# Patient Record
Sex: Male | Born: 1955 | Race: White | Hispanic: No | Marital: Married | Smoking: Never smoker
Health system: Southern US, Community
[De-identification: ages and names within clinical notes are randomized; demographics above are authoritative.]

## PROBLEM LIST (undated history)

## (undated) DIAGNOSIS — I10 Essential (primary) hypertension: Secondary | ICD-10-CM

## (undated) DIAGNOSIS — D803 Selective deficiency of immunoglobulin G [IgG] subclasses: Secondary | ICD-10-CM

## (undated) DIAGNOSIS — M1990 Unspecified osteoarthritis, unspecified site: Secondary | ICD-10-CM

## (undated) DIAGNOSIS — E119 Type 2 diabetes mellitus without complications: Secondary | ICD-10-CM

## (undated) DIAGNOSIS — E039 Hypothyroidism, unspecified: Secondary | ICD-10-CM

## (undated) DIAGNOSIS — N189 Chronic kidney disease, unspecified: Secondary | ICD-10-CM

## (undated) DIAGNOSIS — G4730 Sleep apnea, unspecified: Secondary | ICD-10-CM

## (undated) DIAGNOSIS — E559 Vitamin D deficiency, unspecified: Secondary | ICD-10-CM

## (undated) HISTORY — PX: KNEE ARTHROSCOPY: SUR90

## (undated) HISTORY — DX: Type 2 diabetes mellitus without complications: E11.9

## (undated) HISTORY — DX: Essential (primary) hypertension: I10

## (undated) HISTORY — DX: Sleep apnea, unspecified: G47.30

## (undated) HISTORY — PX: CATARACT EXTRACTION: SUR2

## (undated) HISTORY — DX: Unspecified osteoarthritis, unspecified site: M19.90

---

## 2005-05-12 ENCOUNTER — Encounter: Admission: RE | Admit: 2005-05-12 | Discharge: 2005-05-12 | Payer: Self-pay | Admitting: Orthopedic Surgery

## 2005-05-19 ENCOUNTER — Ambulatory Visit (HOSPITAL_COMMUNITY): Admission: RE | Admit: 2005-05-19 | Discharge: 2005-05-20 | Payer: Self-pay | Admitting: Orthopedic Surgery

## 2006-07-20 HISTORY — PX: LASIK: SHX215

## 2008-01-06 ENCOUNTER — Ambulatory Visit (HOSPITAL_COMMUNITY): Admission: RE | Admit: 2008-01-06 | Discharge: 2008-01-06 | Payer: Self-pay | Admitting: Urology

## 2009-02-22 ENCOUNTER — Encounter: Admission: RE | Admit: 2009-02-22 | Discharge: 2009-02-22 | Payer: Self-pay | Admitting: Orthopedic Surgery

## 2009-02-28 ENCOUNTER — Ambulatory Visit (HOSPITAL_COMMUNITY): Admission: RE | Admit: 2009-02-28 | Discharge: 2009-02-28 | Payer: Self-pay | Admitting: Orthopedic Surgery

## 2010-10-25 LAB — BASIC METABOLIC PANEL
BUN: 21 mg/dL (ref 6–23)
CO2: 24 mEq/L (ref 19–32)
Calcium: 9.3 mg/dL (ref 8.4–10.5)
Chloride: 104 mEq/L (ref 96–112)
Creatinine, Ser: 1.53 mg/dL — ABNORMAL HIGH (ref 0.4–1.5)
GFR calc Af Amer: 58 mL/min — ABNORMAL LOW (ref >60)
GFR calc non Af Amer: 48 mL/min — ABNORMAL LOW (ref >60)
Glucose, Bld: 114 mg/dL — ABNORMAL HIGH (ref 70–99)
Potassium: 4 mEq/L (ref 3.5–5.1)
Sodium: 136 mEq/L (ref 135–145)

## 2010-10-25 LAB — CBC
HCT: 42.6 % (ref 39.0–52.0)
Hemoglobin: 14.8 g/dL (ref 13.0–17.0)
MCHC: 34.7 g/dL (ref 30.0–36.0)
MCV: 91.2 fL (ref 78.0–100.0)
Platelets: 170 10*3/uL (ref 150–400)
RBC: 4.67 MIL/uL (ref 4.22–5.81)
RDW: 13.2 % (ref 11.5–15.5)
WBC: 6.8 10*3/uL (ref 4.0–10.5)

## 2010-12-02 NOTE — Op Note (Signed)
Patrick Kane, BAUMGARTEN               ACCOUNT NO.:  [id number]   MEDICAL RECORD NO.:  [id number]          PATIENT TYPE:  AMB   LOCATION:  SDS                          FACILITY:  [facility]   PHYSICIAN:  Rodney A. Mortenson, M.D.DATE OF BIRTH:  [birth year]   DATE OF PROCEDURE:  02/28/2009  DATE OF DISCHARGE:  02/28/2009                               OPERATIVE REPORT   JUSTIFICATION:  A 55 year old male started having pain in his left knee  in August 2009.  X-rays revealed partial collapse of medial compartment,  no loose bodies.  There was some concern he may have a tear of his  medial meniscus with his localized tenderness.  The pain continued and  has failed all conservative care.  An MRI was then done and this showed  severe tricompartmental osteoarthritis, worsening medial compartment,  and an oblique undersurface tear of the posterior horn of medial  meniscus with extrusion of meniscus.  Because of persistent pain and  discomfort, he is now admitted for arthroscopic evaluation and  treatment.  It was clearly explained to the patient mostly his pain may  be related to the arthritic changes in his knee or a large part could be  secondary to the torn meniscus.  He wished to proceed in the hopes that  the menisci element is the major portion, but no guarantee could be  given as to outcome and he clearly understands.  Questions answered and  encouraged.  Complication discussed extensively.   JUSTIFICATION OUTPATIENT SURGERY:  Minimal morbidity.   PREOPERATIVE DIAGNOSIS:  Osteoarthritis, left knee; tear of posterior  horn of medial meniscus, left knee.   POSTOPERATIVE DIAGNOSIS:  Severe osteoarthritis, left knee with multiple  horizontal tears undersurface of posterior horn of medial meniscus, left  knee.   OPERATION:  Arthroscopy; debridement of posterior horn of medial  meniscus, left knee.   SURGEON:  Lenard Galloway. Chaney Malling, MD   ASSISTANT:  Oris Drone. Petrarca, PA-C   ANESTHESIA:   General.   PATHOLOGY:  With arthroscope of the knee, very careful examination was  undertaken.  There was fair loss of articular cartilage in the femoral  notch and this showed grade 3 changes.  There were grade 2 changes on  the posterior aspect of patella.  In the lateral compartment, there is  normal articular cartilage.  The lateral femoral condyle, lateral tibial  plateau, and entire circumference of lateral meniscus appeared normal.  The anterior cruciate was normal.  All the pathology seen in the medial  compartment.  There was an area of complete raw bone over the medial  tibial plateau and there was fraying and tearing of articular cartilage  of the weightbearing area of the medial femoral condyle.  The medial  meniscus was visualized and was deformed, but no huge tears could be  seen.  The posterior horn was lifted, there were multiple horizontal  tears in the posterior horn on the undersurface.  This could not be  displaced into the joint and this was not flipped into the joint as was  seen on the MRI.   PROCEDURE:  The patient placed  on the operating table in supine  position.  A pneumatic tourniquet was applied about the left upper  thigh.  Entire left lower extremity was prepped with DuraPrep and draped  out in the usual manner.  An infusion cannula was placed superomedial  pouch and the knee was distended with saline.  Anteromedial,  anterolateral, and midline patellar portal were made as the knee was  very large and difficult to access.  Findings as described above.  Most  pathology that was in the medial compartment.  Again, there were  horizontal tears in the undersurface of the posterior horn and through  both medial and lateral portals, the posterior horn was debrided with a  small basket.  Once this was accomplished to my satisfaction with fairly  good decompression, ArthroCare wand was put in place.  The remaining rim  was then smoothed and balanced and sculpted.   Once this was accomplished  to my satisfaction, Marcaine was placed in the knee  Several of the  large portals were closed with 3-0 nylon and a large bulky pressure  dressing was applied.  The patient was returned to recovery room in  excellent condition.  Technically, this went extremely well.   DISPOSITION:  1. Percocet for pain.  2. Follow up in my office on Friday in [place] next week.  3. Return sooner if he has any trouble.  4. Usual postop instructions were given.      Rodney A. Chaney Malling, M.D.  Electronically Signed     RAM/MEDQ  D:  02/28/2009  T:  03/01/2009  Job:  [id number]

## 2010-12-05 NOTE — Op Note (Signed)
Patrick Kane, Patrick Kane               ACCOUNT NO.:  [id number]   MEDICAL RECORD NO.:  [id number]          PATIENT TYPE:  AMB   LOCATION:  SDS                          FACILITY:  [facility]   PHYSICIAN:  Rodney A. Mortenson, M.D.DATE OF BIRTH:  [birth year]   DATE OF PROCEDURE:  05/19/2005  DATE OF DISCHARGE:                                 OPERATIVE REPORT   PREOPERATIVE DIAGNOSIS:  Tear medial meniscus, right knee.   POSTOPERATIVE DIAGNOSIS:  Tear medial meniscus, right knee; grade 4 lesion  femoral notch area; chondromalacia of the right patella, loose body, right  knee.   OPERATION:  Partial medial meniscectomy, chondroplasty of femoral notch and  patella, removal of loose body, right knee.   ANESTHESIA:  General.   SURGEON:  Rodney A. Chaney Malling, M.D.   PATHOLOGY:  With the arthroscope in the knee, very careful examination of  the knee was undertaken.  The patellofemoral joint was visualized first.  There was some chondromalacia of the patella but there was a large lesion in  the femoral notch area where there was full thickness articular cartilage  loss.  There was also a loose body which was attached to the superior aspect  of the femur at the articular and nonarticular edge just medial to the  medial patellar facet.  The arthroscope was then passed into the lateral  compartment.  There was normal articular cartilage, lateral femoral condyle,  lateral tibial plateau, and the entire circumference of the lateral meniscus  was normal.  The ACL was visualized and this was normal.  The arthroscope  was placed in the medial compartment.  There was fairly normal looking  articular cartilage over the femoral condyle.  There was early cartilage  damage to the medial tibial plateau but this may be grade 1 and grade 2  changes.  There was a radial tear of the mid third of the medial meniscus  and the tear extended from the free edge all the way to the capsular  attachment.   PROCEDURE:  The  patient was placed on the operating table in supine  position, pneumatic tourniquet about the right upper thigh.  The right leg  was placed in a leg holder and the entire right lower extremity was prepped  with DuraPrep and draped out in the usual manner.  An infusion cannula was  placed in the superomedial pouch and the knee distended with saline.  An  anteromedial and anterolateral portal was made and the arthroscope was  introduced.   Attention was turned first to the superior patellar area.  There was  chondromalacia of the patella and this was debrided with a chondroplasty  shaver.  There was a large loose body attached to the anterior aspect of the  femur just proximal to the articular surface of the medial femoral condyle  and this was grasped with a pituitary and removed and this area was then  debrided.  Down in the femoral notch area, there was an area of total loss  of articular cartilage and this was debrided with the chondroplasty shaver.  The arthroscope was then placed in  the lateral compartment and, as noted  above, this was normal.  The arthroscope was then placed in the medial  compartment.  There was a radial tear of the medial meniscus which extended  from the free edge all the way to the capsular  attachment.  This could be  lifted with a probe.  A series of baskets were inserted through both medial  and lateral portals and this area was debrided aggressively.  The intra-  articular shaver was then introduced and the rim was smoothed and balanced  to a nice transition to the mid third and the posterior third.  No other  significant lesion was seen in the knee.  The  knee was then filled with Marcaine.  A large , bulky, pressure dressing was  applied.  The patient returned to the recovery room in excellent condition.  Technically, the procedure went extremely well.  Drains none.  Complications  none.           ______________________________  Patrick Kane. Chaney Malling,  M.D.     RAM/MEDQ  D:  05/19/2005  T:  05/19/2005  Job:  [id number]

## 2014-07-20 HISTORY — PX: OTHER SURGICAL HISTORY: SHX169

## 2014-11-29 ENCOUNTER — Ambulatory Visit (HOSPITAL_COMMUNITY): Admission: RE | Admit: 2014-11-29 | Payer: Medicare Other | Source: Ambulatory Visit

## 2014-11-29 ENCOUNTER — Ambulatory Visit (HOSPITAL_COMMUNITY): Payer: Medicare Other

## 2014-11-29 ENCOUNTER — Ambulatory Visit (HOSPITAL_COMMUNITY)
Admission: RE | Admit: 2014-11-29 | Discharge: 2014-11-29 | Disposition: A | Discharge status: Home / Self Care | Payer: Medicare Other | Source: Ambulatory Visit | Attending: Neurology | Admitting: Neurology

## 2014-11-29 ENCOUNTER — Encounter: Payer: Self-pay | Admitting: Neurology

## 2014-11-29 ENCOUNTER — Telehealth: Payer: Self-pay | Admitting: Neurology

## 2014-11-29 ENCOUNTER — Other Ambulatory Visit: Payer: Self-pay | Admitting: Neurology

## 2014-11-29 ENCOUNTER — Ambulatory Visit (INDEPENDENT_AMBULATORY_CARE_PROVIDER_SITE_OTHER): Payer: Medicare Other | Admitting: Neurology

## 2014-11-29 VITALS — BP 156/96 | HR 76 | Temp 98.2°F | Ht 69.0 in | Wt 384.6 lb

## 2014-11-29 DIAGNOSIS — R930 Abnormal findings on diagnostic imaging of skull and head, not elsewhere classified: Secondary | ICD-10-CM | POA: Diagnosis not present

## 2014-11-29 DIAGNOSIS — R51 Headache: Secondary | ICD-10-CM | POA: Diagnosis not present

## 2014-11-29 DIAGNOSIS — H0520 Unspecified exophthalmos: Secondary | ICD-10-CM

## 2014-11-29 DIAGNOSIS — I670 Dissection of cerebral arteries, nonruptured: Secondary | ICD-10-CM

## 2014-11-29 DIAGNOSIS — H532 Diplopia: Secondary | ICD-10-CM

## 2014-11-29 DIAGNOSIS — H4922 Sixth [abducent] nerve palsy, left eye: Secondary | ICD-10-CM | POA: Diagnosis not present

## 2014-11-29 LAB — CREATININE, SERUM
Creatinine, Ser: 1.73 mg/dL — ABNORMAL HIGH (ref 0.61–1.24)
GFR calc Af Amer: 48 mL/min — ABNORMAL LOW (ref >60)
GFR calc non Af Amer: 41 mL/min — ABNORMAL LOW (ref >60)

## 2014-11-29 MED ORDER — GADOBENATE DIMEGLUMINE 529 MG/ML IV SOLN
10.0000 mL | Freq: Once | INTRAVENOUS | Status: AC | PRN
  Administered 2014-11-29: 10 mL via INTRAVENOUS

## 2014-11-29 NOTE — Telephone Encounter (Signed)
Patrick Kane from [facility] [phone]) called and stated that they need to know how long it will be until the patients blood results are in from his appt. Today. The patient is in their office and they cannot do anything until they have the results from those labs. Please call and advise.

## 2014-11-29 NOTE — Progress Notes (Addendum)
[facility]    Provider:  Dr Jaynee Eagles Referring Provider: Okey Regal, OD Primary Care Physician:  Glenda Chroman., MD  CC:  Acute diplopia  HPI:  Patrick Kane is a 59 y.o. male here as a referral from Dr. Hassell Done for Acute diplopia. He has a PMHx of morbid obesity, OSA on cpap, HTN. Diplopia was acute in onset. Started Saturday, he had a severe sinus headache.  He took his daughter to work. While he was driving, he noticed the double vision. He was seeing the cars and roads "side by side", seeing double. The double vision is worse farther away and looking straight and to the left. He has had exopthalmos for a long time but today they notice L>>R proptosis  that is completely new. The double vision is only when he has both eyes open, it resolves with one eye closed. No pain on eye movement. Eye is dry, itchy. No headache since Saturday. Closing one eye makes the double vision go away. He denies any other focal neurologic deficits. He endorses an extremely high cpap pressure that makes his eyes "pop out" in the morning with resolution after stopping the cpap. No previous incidents of diplopia before.   MRI shows diffusely abnormal superficial face and periorbital soft tissue appearance. The bilateral parotid and lacrimal glands are affected and are diffusely abnormal. 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    Reviewed notes, labs and imaging from outside physicians, which showed:  Personally reviewed images and agree with the following. Personally discussed with neuroradiogy:  IMPRESSION: 1. Contrast could not be administered due to inability to obtain IV access. 2. Dominant finding is diffusely abnormal superficial face and periorbital soft tissue appearance. The bilateral parotid  and lacrimal glands are affected and are diffusely abnormal. 3. Bilateral exophthalmos and left worse than right lateral rectus muscle enlargement. 4. Negative non contrast MRI and MRA appearance of the brain. 5. The imaging appearance of #2 and #3 is nonspecific. Favor a systemic inflammatory process as the unifying diagnosis in this case. Dermatology consultation may be valuable. A combination of Thyroid Orbitopathy and Thyroid Acropachy is considered (such as due to Graves disease), but the pattern of extraocular muscle involvement seen here would be very atypical. An unusual infection or unusual presentation of malignancy such as lymphoma also should be considered.  Patient uses full CPAP mask with a very high 21/16 pressure. Morbidly obese at 384.6 pounds. Records reviewed by my eye doctor optometry of [facility] in [place] show patient was seen about his double vision. No visual floaters or light flashes. No pain. Did have a sinus headache before onset. 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    Review of Systems: Patient complains of symptoms per HPI as well as the following symptoms: Double vision, eye pain, headache, ringing in ears, allergies. Pertinent negatives per HPI. All others negative.   History   Social History  . Marital Status: Married    Spouse Name: N/A  . Number of Children: N/A  . Years of Education: N/A   Occupational History  . Not on file.   Social History Main Topics  . Smoking status: Not on file  . Smokeless tobacco: Not on file  . Alcohol Use: Not on file  . Drug Use: Not on file  . Sexual Activity: Not  on file   Other Topics Concern  . Not on file   Social History Narrative    No family history on file.  Past Medical History  Diagnosis Date  . Hypertension   . Diabetes   . Sleep apnea   . Arthritis     Past Surgical History  Procedure Laterality Date  . Lasik Bilateral 2008  . Knee arthroscopy Bilateral      Current Outpatient Prescriptions  Medication Sig Dispense Refill  . allopurinol (ZYLOPRIM) 300 MG tablet Take 150 mg by mouth daily.    Marland Kitchen amitriptyline (ELAVIL) 25 MG tablet Take 25 mg by mouth as needed for sleep.    . cetirizine (ZYRTEC) 10 MG tablet Take 10 mg by mouth daily.    . fexofenadine (ALLEGRA) 180 MG tablet Take 180 mg by mouth daily.    Marland Kitchen gabapentin (NEURONTIN) 800 MG tablet Take 400 mg by mouth daily.    . metoprolol succinate (TOPROL-XL) 100 MG 24 hr tablet Take 150 mg by mouth daily. Takes 1.5 tablets each day    . ONETOUCH VERIO test strip     . tiZANidine (ZANAFLEX) 4 MG tablet Take 4 mg by mouth daily. At night    . triamterene-hydrochlorothiazide (MAXZIDE-25) 37.5-25 MG per tablet Take 0.5 tablets by mouth daily.     No current facility-administered medications for this visit.    Allergies as of 11/29/2014 - Review Complete 11/29/2014  Allergen Reaction Noted  . Codeine  11/29/2014    Vitals: BP 156/96 mmHg  Pulse 76  Temp(Src) 98.2 F (36.8 C)  Ht 5\' 9"  (1.753 m)  Wt 384 lb 9.6 oz (174.453 kg)  BMI 56.77 kg/m2 Last Weight:  Wt Readings from Last 1 Encounters:  11/29/14 384 lb 9.6 oz (174.453 kg)   Last Height:   Ht Readings from Last 1 Encounters:  11/29/14 5\' 9"  (1.753 m)   Physical exam: Exam: Gen: NAD, conversant, well nourised, morbidly obese, well groomed                     CV: RRR, no MRG. No Carotid Bruits.  Eyes: Bilateral conjunctival injection left > right  Neuro: Detailed Neurologic Exam  Speech:    Speech is normal; fluent and spontaneous with normal comprehension.  Cognition:    The patient is oriented to person, place, and time;     recent and remote memory intact;     language fluent;     normal attention, concentration,     fund of knowledge Cranial Nerves:    The pupils are equal, round, and reactive to light.Unable to visualize fundi, attempted. Left >> right proptosis. Inability to completely abduct left eye. OD  20/50, OS 20/50. Visual fields are full to finger confrontation. Trigeminal sensation is intact and the muscles of mastication are normal. The face is symmetric. The palate elevates in the midline. Hearing intact. Voice is normal. Shoulder shrug is normal. The tongue has normal motion without fasciculations.   Coordination:    Normal finger to nose, difficulty HTS due to morbid obesisy  Gait:    Wide based due to morbid obesity  Motor Observation:    No asymmetry, no atrophy, and no involuntary movements noted. Tone:    Normal muscle tone.    Posture:    Posture is normal. normal erect    Strength:    Strength is V/V in the upper and lower limbs.      Sensation: intact to LT  Reflex Exam:  DTR's:    Deep tendon reflexes in the upper and lower extremities are hyporeflexic bilaterally, difficult to assess however due to large body habitus.   Toes:    The toes are downgoing bilaterally.   Clonus:    Clonus is absent.       Assessment/Plan:  59 year old morbidly obese patient with acute onset diplopia. 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   MRI shows diffusely abnormal superficial face and periorbital soft tissue appearance. The bilateral parotid and lacrimal glands are affected and are diffusely abnormal. The lateral recti muscles (L>R) are also enlarged which is the likely cause of patient's diplopia and proptosis.  Labs pending.  Suspicious for chronic systemic inflammatory/autoimmune disease in this patient such as Sjogren syndrome, sarcoidosis, thyroiditis, etc. (TSH was within normal limits however) But also highly suspicious of progression to an infiltrating lymphoma given the mass-like appearance of the lacrimal glands and lateral rectus muscles.   Skin punch biopsy and/or left lacrimal gland biopsy might prove most Valuable. Patient has an appointment with Dr. Nevada Crane who was a dermatologist in  [facility] for possible punch biopsy evaluation.   Sarina Ill, MD  [facility] [address] [place], [place] [address]  Phone [phone] Fax [phone]

## 2014-11-29 NOTE — Telephone Encounter (Signed)
Talked with Tashia and Dr. Jaynee Eagles will put in stat creatinine for pt to have done before MRI at 6 pm. Dr. Jaynee Eagles putting in orders. Tashia verbalized understanding.

## 2014-11-30 ENCOUNTER — Other Ambulatory Visit: Payer: Self-pay | Admitting: Neurology

## 2014-11-30 ENCOUNTER — Other Ambulatory Visit: Payer: Self-pay | Admitting: *Deleted

## 2014-11-30 ENCOUNTER — Telehealth: Payer: Self-pay | Admitting: Neurology

## 2014-11-30 ENCOUNTER — Ambulatory Visit (HOSPITAL_COMMUNITY): Payer: Self-pay

## 2014-11-30 DIAGNOSIS — C8591 Non-Hodgkin lymphoma, unspecified, lymph nodes of head, face, and neck: Secondary | ICD-10-CM

## 2014-11-30 DIAGNOSIS — L089 Local infection of the skin and subcutaneous tissue, unspecified: Secondary | ICD-10-CM

## 2014-11-30 DIAGNOSIS — L989 Disorder of the skin and subcutaneous tissue, unspecified: Secondary | ICD-10-CM

## 2014-11-30 DIAGNOSIS — D759 Disease of blood and blood-forming organs, unspecified: Secondary | ICD-10-CM

## 2014-11-30 DIAGNOSIS — M069 Rheumatoid arthritis, unspecified: Secondary | ICD-10-CM

## 2014-11-30 LAB — CBC
Hematocrit: 47.8 % (ref 37.5–51.0)
Hemoglobin: 16.2 g/dL (ref 12.6–17.7)
MCH: 31.3 pg (ref 26.6–33.0)
MCHC: 33.9 g/dL (ref 31.5–35.7)
MCV: 93 fL (ref 79–97)
Platelets: 181 10*3/uL (ref 150–379)
RBC: 5.17 x10E6/uL (ref 4.14–5.80)
RDW: 14.4 % (ref 12.3–15.4)
WBC: 7.5 10*3/uL (ref 3.4–10.8)

## 2014-11-30 LAB — COMPREHENSIVE METABOLIC PANEL
ALT: 26 IU/L (ref 0–44)
AST: 23 IU/L (ref 0–40)
Albumin/Globulin Ratio: 1 — ABNORMAL LOW (ref 1.1–2.5)
Albumin: 4.1 g/dL (ref 3.5–5.5)
Alkaline Phosphatase: 91 IU/L (ref 39–117)
BUN/Creatinine Ratio: 11 (ref 9–20)
BUN: 18 mg/dL (ref 6–24)
Bilirubin Total: 0.6 mg/dL (ref 0.0–1.2)
CO2: 19 mmol/L (ref 18–29)
Calcium: 9.8 mg/dL (ref 8.7–10.2)
Chloride: 96 mmol/L — ABNORMAL LOW (ref 97–108)
Creatinine, Ser: 1.61 mg/dL — ABNORMAL HIGH (ref 0.76–1.27)
GFR calc Af Amer: 53 mL/min/{1.73_m2} — ABNORMAL LOW (ref >59)
GFR calc non Af Amer: 46 mL/min/{1.73_m2} — ABNORMAL LOW (ref >59)
Globulin, Total: 4.1 g/dL (ref 1.5–4.5)
Glucose: 104 mg/dL — ABNORMAL HIGH (ref 65–99)
Potassium: 4.3 mmol/L (ref 3.5–5.2)
Sodium: 140 mmol/L (ref 134–144)
Total Protein: 8.2 g/dL (ref 6.0–8.5)

## 2014-11-30 LAB — THYROID PANEL WITH TSH
Free Thyroxine Index: 2.6 (ref 1.2–4.9)
T3 Uptake Ratio: 26 % (ref 24–39)
T4, Total: 10.1 ug/dL (ref 4.5–12.0)
TSH: 4.47 u[IU]/mL (ref 0.450–4.500)

## 2014-11-30 LAB — C-REACTIVE PROTEIN: CRP: 14.5 mg/L — ABNORMAL HIGH (ref 0.0–4.9)

## 2014-11-30 LAB — SEDIMENTATION RATE: Sed Rate: 13 mm/hr (ref 0–30)

## 2014-11-30 NOTE — Telephone Encounter (Signed)
I have dicussed with Dr. Nevada Crane, he has diffuse facial soft tissue, parotid gland, lacriminal gland, periorbital soft tissues,  extraocular muscles infilatration  Like etiology are systemic inflammatory disease, such as Sjogren's, sarcoidosis, even Lymphoma,   Proceed with dermatology, or ophthalmologist evaluation, potential biopsy of involved tissues may shed light on the diagnosis

## 2014-11-30 NOTE — Telephone Encounter (Signed)
Patient has an appointment with Dr. Nevada Crane, dermatologist, in [place] on Monday. Will ensure he has MRI results.

## 2014-11-30 NOTE — Telephone Encounter (Signed)
Patient called and requested to speak with someone regarding his MRI results. Please call and advise.

## 2014-12-03 ENCOUNTER — Encounter: Payer: Self-pay | Admitting: *Deleted

## 2014-12-03 ENCOUNTER — Telehealth: Payer: Self-pay | Admitting: *Deleted

## 2014-12-03 ENCOUNTER — Telehealth: Payer: Self-pay | Admitting: Neurology

## 2014-12-03 NOTE — Telephone Encounter (Signed)
Called and spoke with Rod Holler to let her know I called [name] at [phone] and asked them to transfer me to the lab. I had them read of the orders from 11/30/14 which was a total of 10, and they can see all orders. I made Rod Holler aware. I told her she can go there and have pt get them done. She verbalized understanding. Rod Holler also wondering if they need to set up a follow- up appt. I looked at office visit notes and did not see anything, but told her I would check with Dr. Jaynee Eagles. She verbalized understanding.

## 2014-12-03 NOTE — Telephone Encounter (Signed)
Called Dr. Allyn Kenner, PA office and asked for fax number. They gave me [phone] as the fax number. I faxed over recent office visit notes at 8:48 am.

## 2014-12-03 NOTE — Telephone Encounter (Signed)
Patient should follow up with me after the results come back for the biopsies. Will call with lab results.  thanks

## 2014-12-03 NOTE — Telephone Encounter (Signed)
Talked with Rod Holler, the wife and they stated Patrick Kane cannot see the orders from Dr. Jaynee Eagles for lab work. Pt went to hospital and lab across the street. I spoke with Joycelyn Schmid from lab and she stated she cannot see the orders either. I told them to come tomorrow to our office to have lab work completed and to come before 12 or after 1230. Pt verbalized understanding.

## 2014-12-03 NOTE — Telephone Encounter (Signed)
error 

## 2014-12-03 NOTE — Telephone Encounter (Signed)
Received fax confirmation

## 2014-12-03 NOTE — Telephone Encounter (Signed)
Already spoke to patient, see previous phone note thanks

## 2014-12-03 NOTE — Telephone Encounter (Signed)
Patients wife called and stated that Dr. Jaynee Eagles called her Friday and told her that blood work needed to be ordered. Rod Holler stated that she has been waiting on the blood work request since Friday and the pt has an appt today at 3:00. She would like to speak with someone regarding this before his appt today. Please call and advise.

## 2014-12-03 NOTE — Telephone Encounter (Signed)
[place] Number [phone]

## 2014-12-04 ENCOUNTER — Other Ambulatory Visit (INDEPENDENT_AMBULATORY_CARE_PROVIDER_SITE_OTHER): Payer: Self-pay

## 2014-12-04 ENCOUNTER — Other Ambulatory Visit: Payer: Self-pay | Admitting: Neurology

## 2014-12-04 DIAGNOSIS — L089 Local infection of the skin and subcutaneous tissue, unspecified: Secondary | ICD-10-CM

## 2014-12-04 DIAGNOSIS — D759 Disease of blood and blood-forming organs, unspecified: Secondary | ICD-10-CM

## 2014-12-04 DIAGNOSIS — H5789 Other specified disorders of eye and adnexa: Secondary | ICD-10-CM

## 2014-12-04 DIAGNOSIS — Z0289 Encounter for other administrative examinations: Secondary | ICD-10-CM

## 2014-12-04 DIAGNOSIS — M069 Rheumatoid arthritis, unspecified: Secondary | ICD-10-CM

## 2014-12-04 DIAGNOSIS — E0500 Thyrotoxicosis with diffuse goiter without thyrotoxic crisis or storm: Secondary | ICD-10-CM

## 2014-12-04 DIAGNOSIS — L989 Disorder of the skin and subcutaneous tissue, unspecified: Secondary | ICD-10-CM

## 2014-12-04 NOTE — Telephone Encounter (Signed)
Called patient to let him know Dr. Jaynee Eagles would like him to follow up with her after the biopsy results come back and we will call about the lab work results. Pt stated he is coming between 1:00 and 2:00pm to have lab work done here at our office. I told him to call with any more questions. Pt verbalized understanding.

## 2014-12-06 LAB — MULTIPLE MYELOMA PANEL, SERUM
ALPHA 1: 0.2 g/dL (ref 0.1–0.4)
ALPHA2 GLOB SERPL ELPH-MCNC: 0.8 g/dL (ref 0.4–1.2)
Albumin SerPl Elph-Mcnc: 3.6 g/dL (ref 3.2–5.6)
Albumin/Glob SerPl: 0.9 (ref 0.7–2.0)
B-Globulin SerPl Elph-Mcnc: 1.5 g/dL — ABNORMAL HIGH (ref 0.6–1.3)
Gamma Glob SerPl Elph-Mcnc: 1.8 g/dL — ABNORMAL HIGH (ref 0.5–1.6)
Globulin, Total: 4.4 g/dL (ref 2.0–4.5)
IGM (IMMUNOGLOBULIN M), SRM: 14 mg/dL — AB (ref 20–172)
IgA/Immunoglobulin A, Serum: 109 mg/dL (ref 90–386)
IgG (Immunoglobin G), Serum: 2309 mg/dL — ABNORMAL HIGH (ref 700–1600)
Total Protein: 8 g/dL (ref 6.0–8.5)

## 2014-12-06 LAB — RPR: RPR Ser Ql: NONREACTIVE

## 2014-12-06 LAB — HIGH SENSITIVITY CRP: CRP, High Sensitivity: 6.4 mg/L — ABNORMAL HIGH (ref 0.00–3.00)

## 2014-12-06 LAB — PAN-ANCA
Atypical pANCA: <1:20 {titer}
Myeloperoxidase Ab: <9 U/mL (ref 0.0–9.0)
P-ANCA: <1:20 {titer}

## 2014-12-06 LAB — HIV ANTIBODY (ROUTINE TESTING W REFLEX): HIV Screen 4th Generation wRfx: NONREACTIVE

## 2014-12-06 LAB — ANTI-SCLERODERMA ANTIBODY: Scleroderma SCL-70: <0.2 AI (ref 0.0–0.9)

## 2014-12-06 LAB — ANGIOTENSIN CONVERTING ENZYME: Angio Convert Enzyme: 43 U/L (ref 14–82)

## 2014-12-06 LAB — RHEUMATOID FACTOR: RHEUMATOID FACTOR: 54.4 [IU]/mL — AB (ref 0.0–13.9)

## 2014-12-06 LAB — ANA W/REFLEX: Anti Nuclear Antibody(ANA): NEGATIVE

## 2014-12-11 ENCOUNTER — Telehealth: Payer: Self-pay

## 2014-12-11 ENCOUNTER — Other Ambulatory Visit: Payer: Self-pay | Admitting: Neurology

## 2014-12-11 DIAGNOSIS — L932 Other local lupus erythematosus: Secondary | ICD-10-CM

## 2014-12-11 DIAGNOSIS — H532 Diplopia: Secondary | ICD-10-CM

## 2014-12-11 DIAGNOSIS — M069 Rheumatoid arthritis, unspecified: Secondary | ICD-10-CM

## 2014-12-11 DIAGNOSIS — H4942 Progressive external ophthalmoplegia, left eye: Secondary | ICD-10-CM

## 2014-12-11 DIAGNOSIS — H0520 Unspecified exophthalmos: Secondary | ICD-10-CM

## 2014-12-11 NOTE — Telephone Encounter (Signed)
Pt called requesting to speak with someone regarding results to the blood work he had done last week. Please call and advise.

## 2014-12-11 NOTE — Telephone Encounter (Signed)
Spoke to patient, his RF was 44 will refer him to rheumatology. He would like a copy of all the labwork. Terrence Dupont, can you ask medical records to get him a copy please? Maybe he needs to sign a release of records. Thanks.

## 2014-12-11 NOTE — Telephone Encounter (Signed)
Patient has apt with Dr. Katy Fitch 12-12-2014 at 8:45 am. We are still waiting on response from Dr. Berna Bue this is where patient wanted to go . I have called Dr.Hawkes office and asked them to schedule patient as soon as they could notes and records have also been faxed.  Patient would like Dr.Ahern to call him today. Patient is fine with this process and will be looking forward to Dr.Ahern's call.

## 2014-12-12 ENCOUNTER — Encounter: Payer: Self-pay | Admitting: *Deleted

## 2014-12-12 NOTE — Patient Instructions (Signed)
Mailed pt lab results from 12/04/14 on 12/12/14.

## 2014-12-12 NOTE — Telephone Encounter (Signed)
Dr. Jaynee Eagles, I mailed pt lab results. Thank you.

## 2014-12-13 ENCOUNTER — Other Ambulatory Visit: Payer: Self-pay | Admitting: Neurology

## 2014-12-13 ENCOUNTER — Telehealth: Payer: Self-pay | Admitting: Neurology

## 2014-12-13 DIAGNOSIS — E079 Disorder of thyroid, unspecified: Secondary | ICD-10-CM

## 2014-12-13 NOTE — Telephone Encounter (Signed)
Spoke to patient. Saw Dr. Katy Fitch (Ophthalmology) and he recommended Endocrinology referral. Patient also said he can' t be seen in Rheumatology until end of July.   Dana(Or Joy or Larey Seat) - Would you please see if we can get an Endocrinology appointment for this patient within the next few weeks or asap?   Also, can we call around and see if any other rheumatologist has any earlier appointments than July?  I have printed out and left the referral packet notes on Dana's desk.   Thank you

## 2014-12-13 NOTE — Telephone Encounter (Signed)
Patrick Kane, pt's wife called requesting to speak with Dr. Jaynee Eagles regarding some referrals that were going to take place on pt's last visit. She does not remember the providers that he would be seeing. Patient did see an eye doctor, Dr. Katy Fitch yesterday 12/12/14. Please call and advise. Patrick Kane can be reached @ [phone]

## 2014-12-14 NOTE — Telephone Encounter (Signed)
I called [facility] [phone]. And they wanted me to resend notes and orders and they will call me back with an apt with first available doctor with in next couple of weeks. I will also follow back up with them before I leave today to make sure patient is scheduled. For his  [facility] apt. Fax [phone]  Faxed order and notes to END they have opening's they will call me back buy the end of the day of to schedule apt. I will also follow back up with them by the end of the day.  Dr. Dennie Bible timer [phone]  This is still in process but i will have apt soon it has to go threw referral process first.

## 2014-12-15 ENCOUNTER — Telehealth: Payer: Self-pay | Admitting: Neurology

## 2014-12-15 ENCOUNTER — Other Ambulatory Visit: Payer: Self-pay | Admitting: Neurology

## 2014-12-15 DIAGNOSIS — IMO0002 Reserved for concepts with insufficient information to code with codable children: Secondary | ICD-10-CM

## 2014-12-15 NOTE — Telephone Encounter (Signed)
Patrick Kane - Dr. Katy Fitch from ophthalmology recommended a few more lab tests for Patrick Kane. Can you please call him and let him know? We need to figure out how he can get them done at [facility]. Maybe we can call the supervisor and fax the lab requests there. Let him know we will work on it so there is no confusion like last time, thanks.

## 2014-12-18 ENCOUNTER — Telehealth: Payer: Self-pay | Admitting: Neurology

## 2014-12-18 ENCOUNTER — Inpatient Hospital Stay (HOSPITAL_COMMUNITY): Admit: 2014-12-18 | Payer: Self-pay

## 2014-12-18 NOTE — Telephone Encounter (Signed)
Spoke with Otila Kluver from [facility] lab and she stated she can see all lab work from 5/28  (4 labs) that Dr. Jaynee Eagles ordered for pt to have done. Pt wife, Rod Holler aware and is going to have pt get this done.  Also spoke with her about referrals to endocrinology and rheumatology and told her I would follow up with Rance Muir in our office to see if they can be sent somewhere else because insurance will not cover a visit at Dr. Legrand Como [place] office. She stated she would prefer to go to Dr. Chalmers Cater office at [facility] but there is no availability until July. She wants to get in to be seen sooner. I told her I would check with Hinton Dyer and we would check on the status. Pt wife verbalized understanding.

## 2014-12-18 NOTE — Telephone Encounter (Signed)
Patrick Kane, Dr. Katy Fitch (opthalmologist) recommended some additional lab work. I think these labs are a good idea, would you see how we can get patient to the lab? Maybe we can call [name] and fax the orders (get a name, talk to the supervisor possibly). Woul dyou arrange this with patient and [name] please? Thank you!

## 2014-12-19 ENCOUNTER — Ambulatory Visit (INDEPENDENT_AMBULATORY_CARE_PROVIDER_SITE_OTHER): Payer: Medicare Other | Admitting: Neurology

## 2014-12-19 ENCOUNTER — Other Ambulatory Visit: Payer: Self-pay | Admitting: *Deleted

## 2014-12-19 VITALS — BP 171/82 | HR 72 | Temp 97.5°F | Ht 69.0 in | Wt 389.0 lb

## 2014-12-19 DIAGNOSIS — E079 Disorder of thyroid, unspecified: Secondary | ICD-10-CM

## 2014-12-19 DIAGNOSIS — IMO0002 Reserved for concepts with insufficient information to code with codable children: Secondary | ICD-10-CM

## 2014-12-19 DIAGNOSIS — M069 Rheumatoid arthritis, unspecified: Secondary | ICD-10-CM

## 2014-12-19 DIAGNOSIS — E0500 Thyrotoxicosis with diffuse goiter without thyrotoxic crisis or storm: Secondary | ICD-10-CM | POA: Diagnosis not present

## 2014-12-19 DIAGNOSIS — C9190 Lymphoid leukemia, unspecified not having achieved remission: Secondary | ICD-10-CM

## 2014-12-19 DIAGNOSIS — L988 Other specified disorders of the skin and subcutaneous tissue: Secondary | ICD-10-CM

## 2014-12-19 DIAGNOSIS — L989 Disorder of the skin and subcutaneous tissue, unspecified: Secondary | ICD-10-CM | POA: Diagnosis not present

## 2014-12-19 DIAGNOSIS — D8989 Other specified disorders involving the immune mechanism, not elsewhere classified: Secondary | ICD-10-CM | POA: Diagnosis not present

## 2014-12-19 DIAGNOSIS — L932 Other local lupus erythematosus: Secondary | ICD-10-CM

## 2014-12-19 DIAGNOSIS — L986 Other infiltrative disorders of the skin and subcutaneous tissue: Secondary | ICD-10-CM

## 2014-12-19 MED ORDER — PREDNISONE 10 MG PO TABS: 40.0000 mg | ORAL_TABLET | Freq: Every day | ORAL | Status: DC

## 2014-12-19 NOTE — Telephone Encounter (Signed)
I have called Dr. Charlestine Night office and patient is scheduled for June 15 th at 1:30 . If any one cx's he is also on CX list for next week as well . The office will call patient to schedule. I have also called Dr. Almetta Lovely s office per wife request . They could schedule apt. Right then I was requested to fax order and notes and Dr. Almetta Lovely  office will call me with apt. I did relay patient needed to be seen soon.  Patient's wife also aware of details . Patient will be here to see Dr.Ahren today.

## 2014-12-19 NOTE — Progress Notes (Signed)
[facility]   Provider: Dr Jaynee Eagles Referring Provider: Okey Regal, OD Primary Care Physician: Glenda Chroman., MD  CC: Acute diplopia  Interval update: The double vision is still there when he looks to the far left. The left  side of the face is fuller, feels like a tennis ball in his cheeks. He took some left over prednisone for a few days and he feels better. Discussed the biopsy which showed diffuse lymphocytic infiltration of the gland, tissues of his face as well as bilateral lateral right rectus muscles. Extensive lab work including ANA with reflex, angiotensin-converting enzyme, HIV, multiple myeloma, pan-Anka, CRP, anti-scleroderma antibody, RPR were all unremarkable. His rheumatoid factor did return high at 54. Reviewed imaging and imaging results. Also discussed recommendations from Dr. Carolynn Sayers that we consider a very rare disorder called IgG4 related disease. We'll test for IgG, IgA, IgM, IgG, IgE and IgG4 specifically and other labs today. Rheumatology and endocrinology appointments have been scheduled. We'll also try and schedule him for parotid biopsy per Dr. Zenia Resides recommendations. Today we'll start steroids.  Patient has diabetes but he remains untreated because his hemoglobin A1c is less than 7. Discussed my concerns about the side effects of steroids especially elevated glucose and the possibility for diabetic ketoacidosis. Patient and wife very much would like to start steroids today. His wife is in attendance today. Discussed other side effects including insomnia, mania, high blood pressure, increased appetite, edema. Have asked patient to check his glucose 4 times daily. We'll start him at a high dose of 40 mg and if glucose elevated significantly we'll decrease him immediately.    11/29/2014: Patrick Kane is a 59 y.o. male here as a referral from Dr. Hassell Done for Acute diplopia. He has a PMHx of morbid obesity, OSA on cpap, HTN. Diplopia was acute in onset.  Started Saturday, he had a severe sinus headache. He took his daughter to work. While he was driving, he noticed the double vision. He was seeing the cars and roads "side by side", seeing double. The double vision is worse farther away and looking straight and to the left. He has had exopthalmos for a long time but today they notice L>>R proptosis that is completely new. The double vision is only when he has both eyes open, it resolves with one eye closed. No pain on eye movement. Eye is dry, itchy. No headache since Saturday. Closing one eye makes the double vision go away. He denies any other focal neurologic deficits. He endorses an extremely high cpap pressure that makes his eyes "pop out" in the morning with resolution after stopping the cpap. No previous incidents of diplopia before.   MRI shows diffusely abnormal superficial face and periorbital soft tissue appearance. The bilateral parotid and lacrimal glands are affected and are diffusely abnormal. 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    Reviewed notes, labs and imaging from outside physicians, which showed:  Personally reviewed images and agree with the following. Personally discussed with neuroradiogy:  IMPRESSION: 1. Contrast could not be administered due to inability to obtain IV access. 2. Dominant finding is diffusely abnormal superficial face and periorbital soft tissue appearance. The bilateral parotid and lacrimal glands are affected and are diffusely abnormal. 3. Bilateral exophthalmos and left worse than right lateral rectus muscle enlargement. 4. Negative non contrast MRI and MRA  appearance of the brain. 5. The imaging appearance of #2 and #3 is nonspecific. Favor a systemic inflammatory process as the unifying diagnosis in this case.  Dermatology consultation may be valuable. A combination of Thyroid Orbitopathy and Thyroid Acropachy is considered (such as due to Graves disease), but the pattern of extraocular muscle involvement seen here would be very atypical. An unusual infection or unusual presentation of malignancy such as lymphoma also should be considered.  Patient uses full CPAP mask with a very high 21/16 pressure. Morbidly obese at 384.6 pounds. Records reviewed by my eye doctor optometry of [place] in [place] show patient was seen about his double vision. No visual floaters or light flashes. No pain. Did have a sinus headache before onset. 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   Review of Systems: Patient complains of symptoms per HPI as well as the following symptoms: Eye redness, double vision, facial swelling, sleep apnea, dizziness. Pertinent negatives per HPI. All others negative.   History   Social History  . Marital Status: Married    Spouse Name: N/A  . Number of Children: 2  . Years of Education: 12   Occupational History  . Retired     Social History Main Topics  . Smoking status: Never Smoker   . Smokeless tobacco: Not on file  . Alcohol Use: No  . Drug Use: No  . Sexual Activity: Not on file   Other Topics Concern  . Not on file   Social History Narrative   Lives at home with wife and 2 children   Caffeine use: 2-3 cups coffee per week       Family History  Problem Relation Age of Onset  . Colon cancer Father   . Hypertension Father   . Hypertension Mother   . Heart attack Paternal Grandfather   . Heart attack Maternal Grandfather     Past Medical History  Diagnosis Date  . Hypertension   . Diabetes   . Sleep apnea   . Arthritis     Past Surgical History  Procedure Laterality Date  . Lasik Bilateral 2008  . Knee arthroscopy Bilateral     Current Outpatient Prescriptions  Medication Sig Dispense Refill  . allopurinol (ZYLOPRIM) 300 MG  tablet Take 150 mg by mouth daily.    Marland Kitchen amitriptyline (ELAVIL) 25 MG tablet Take 25 mg by mouth as needed for sleep.    . cetirizine (ZYRTEC) 10 MG tablet Take 10 mg by mouth daily.    . fexofenadine (ALLEGRA) 180 MG tablet Take 180 mg by mouth daily.    Marland Kitchen gabapentin (NEURONTIN) 800 MG tablet Take 400 mg by mouth daily.    . metoprolol succinate (TOPROL-XL) 100 MG 24 hr tablet Take 150 mg by mouth daily. Takes 1.5 tablets each day    . ONETOUCH VERIO test strip     . predniSONE (DELTASONE) 10 MG tablet Take 4 tablets (40 mg total) by mouth daily with breakfast. 120 tablet 0  . tiZANidine (ZANAFLEX) 4 MG tablet Take 4 mg by mouth daily. At night    . triamterene-hydrochlorothiazide (MAXZIDE-25) 37.5-25 MG per tablet Take 0.5 tablets by mouth daily.     No current facility-administered medications for this visit.    Allergies as of 12/19/2014 - Review Complete 12/19/2014  Allergen Reaction Noted  . Codeine  11/29/2014    Vitals: BP 171/82 mmHg  Pulse 72  Temp(Src) 97.5 F (36.4 C)  Ht 5' 9"  (1.753 m)  Wt 389  lb (176.449 kg)  BMI 57.42 kg/m2 Last Weight:  Wt Readings from Last 1 Encounters:  12/19/14 389 lb (176.449 kg)   Last Height:   Ht Readings from Last 1 Encounters:  12/19/14 5' 9"  (1.753 m)   Eyes: Bilateral conjunctival injection left > right, exophthalmos L>R  Cranial Nerves:  The pupils are equal, round, and reactive to light.Unable to visualize fundi, attempted. Left >> right proptosis. Inability to completely abduct left eye. Visual fields are full to finger confrontation. Trigeminal sensation is intact and the muscles of mastication are normal. The face is symmetric. The palate elevates in the midline. Hearing intact. Voice is normal. Shoulder shrug is normal. The tongue has normal motion without fasciculations.   Assessment/Plan: 59 year old morbidly obese patient with acute onset diplopia. Exam shows incomplete abduction of the left eye likely due to his  enlarged lateralis rectus, left greater than right conjunctival injection and eye proptosis.   MRI shows diffusely abnormal superficial face and periorbital soft tissue appearance. The bilateral parotid and lacrimal glands are affected and are diffusely abnormal. The lateral recti muscles (L>R) are also enlarged which is the likely cause of patient's diplopia and proptosis.  Suspicious for chronic systemic inflammatory/autoimmune disease in this patient   Very much appreciate Dr. Zenia Resides recommendation that we consider a very rare disorder called IgG4 related disease. We will test for IgG, IgA, IgM, IgG, IgE and IgG4 specifically and other labs today. Rheumatology and endocrinology appointments have been scheduled. We'll also try and schedule him for parotid biopsy per Dr. Zenia Resides recommendations. Today we'll start steroids. Discussed side effects of steroids and concern with patient's diabetes (see details of discussion in history of present illness)  Patient will follow up in 4 weeks.  Sarina Ill, MD  [facility] [address] [place], [place] [address]  Phone [phone] Fax [phone]  A total of 45 minutes was spent face-to-face with this patient. Over half this time was spent on counseling patient on the Igg4-related rare disease diagnosis and different diagnostic and therapeutic options available.

## 2014-12-19 NOTE — Progress Notes (Signed)
Redid referrals for other providers in speciality's (rheumatology and endocrinology).

## 2014-12-19 NOTE — Patient Instructions (Signed)
As far as your medications are concerned, I would like to suggest: Prednisone 40mg  in the morning. Watch glucose 4x a day. Call for any side effects as discussed. Vigilant monitoring of blood pressure and weight.   I would like to see you back in 2 weeks, sooner if we need to. Please call us with any interim questions, concerns, problems, updates or refill requests.   Please also call us for any test results so we can go over those with you on the phone.  My clinical assistant and will answer any of your questions and relay your messages to me and also relay most of my messages to you.   Our phone number is [phone]. We also have an after hours call service for urgent matters and there is a physician on-call for urgent questions. For any emergencies you know to call 911 or go to the nearest emergency room

## 2014-12-19 NOTE — Addendum Note (Signed)
Addended byOliver Hum on: 12/19/2014 11:13 AM   Modules accepted: Orders

## 2014-12-20 ENCOUNTER — Other Ambulatory Visit (INDEPENDENT_AMBULATORY_CARE_PROVIDER_SITE_OTHER): Payer: Self-pay

## 2014-12-20 ENCOUNTER — Encounter: Payer: Self-pay | Admitting: *Deleted

## 2014-12-20 DIAGNOSIS — Z0289 Encounter for other administrative examinations: Secondary | ICD-10-CM

## 2014-12-20 NOTE — Telephone Encounter (Addendum)
Apt has been scheduled with Dr. Chalmers Cater 12/21/2014 at 10:30 . Called and left patient a message of details about his apt. For Dr. Chalmers Cater.

## 2014-12-20 NOTE — Progress Notes (Signed)
Copy of TSH and Hemoglobin A1c labs, with note from Dr. Katy Fitch office visit given to patient to bring to endocrinology appt when pt here getting additional labs drawn today.

## 2014-12-20 NOTE — Telephone Encounter (Signed)
Apt with Dr. Chalmers Cater has been scheduled 12-21-2014. At 10.30 am

## 2014-12-21 ENCOUNTER — Encounter: Payer: Self-pay | Admitting: Neurology

## 2014-12-21 DIAGNOSIS — L986 Other infiltrative disorders of the skin and subcutaneous tissue: Secondary | ICD-10-CM | POA: Insufficient documentation

## 2014-12-22 LAB — C3 AND C4
COMPLEMENT C3, SERUM: 141 mg/dL (ref 82–167)
COMPLEMENT C4, SERUM: 23 mg/dL (ref 14–44)

## 2014-12-22 LAB — CBC WITH DIFFERENTIAL/PLATELET
BASOS ABS: 0 10*3/uL (ref 0.0–0.2)
BASOS: 0 %
EOS (ABSOLUTE): 0 10*3/uL (ref 0.0–0.4)
Eos: 0 %
Hematocrit: 47.1 % (ref 37.5–51.0)
Hemoglobin: 15.6 g/dL (ref 12.6–17.7)
IMMATURE GRANULOCYTES: 0 %
Immature Grans (Abs): 0 10*3/uL (ref 0.0–0.1)
LYMPHS ABS: 1.4 10*3/uL (ref 0.7–3.1)
Lymphs: 16 %
MCH: 30.6 pg (ref 26.6–33.0)
MCHC: 33.1 g/dL (ref 31.5–35.7)
MCV: 92 fL (ref 79–97)
MONOCYTES: 5 %
Monocytes Absolute: 0.4 10*3/uL (ref 0.1–0.9)
NEUTROS ABS: 7.3 10*3/uL — AB (ref 1.4–7.0)
NEUTROS PCT: 79 %
PLATELETS: 196 10*3/uL (ref 150–379)
RBC: 5.1 x10E6/uL (ref 4.14–5.80)
RDW: 14.3 % (ref 12.3–15.4)
WBC: 9.2 10*3/uL (ref 3.4–10.8)

## 2014-12-22 LAB — SYSTEMIC LUPUS PROFILE A
ENA SM Ab Ser-aCnc: <0.2 AI (ref 0.0–0.9)
ENA SSA (RO) Ab: <0.2 AI (ref 0.0–0.9)
ENA SSB (LA) Ab: <0.2 AI (ref 0.0–0.9)
Rhuematoid fact SerPl-aCnc: 42.5 IU/mL — ABNORMAL HIGH (ref 0.0–13.9)
dsDNA Ab: 1 IU/mL (ref 0–9)

## 2014-12-22 LAB — CYCLIC CITRUL PEPTIDE ANTIBODY, IGG/IGA: Cyclic Citrullin Peptide Ab: 2 units (ref 0–19)

## 2014-12-24 LAB — MULTIPLE MYELOMA PANEL, SERUM
ALBUMIN SERPL ELPH-MCNC: 3.6 g/dL (ref 3.2–5.6)
Albumin/Glob SerPl: 0.8 (ref 0.7–2.0)
Alpha 1: 0.3 g/dL (ref 0.1–0.4)
Alpha2 Glob SerPl Elph-Mcnc: 0.8 g/dL (ref 0.4–1.2)
B-Globulin SerPl Elph-Mcnc: 1.1 g/dL (ref 0.6–1.3)
Gamma Glob SerPl Elph-Mcnc: 2.3 g/dL — ABNORMAL HIGH (ref 0.5–1.6)
Globulin, Total: 4.6 g/dL — ABNORMAL HIGH (ref 2.0–4.5)
IGM (IMMUNOGLOBULIN M), SRM: 14 mg/dL — AB (ref 20–172)
IgA/Immunoglobulin A, Serum: 119 mg/dL (ref 90–386)
IgG (Immunoglobin G), Serum: 2497 mg/dL — ABNORMAL HIGH (ref 700–1600)
TOTAL PROTEIN: 8.2 g/dL (ref 6.0–8.5)

## 2014-12-24 LAB — COMPREHENSIVE METABOLIC PANEL
ALT: 26 IU/L (ref 0–44)
AST: 21 IU/L (ref 0–40)
Albumin/Globulin Ratio: 1 — ABNORMAL LOW (ref 1.1–2.5)
Albumin: 3.9 g/dL (ref 3.5–5.5)
BUN/Creatinine Ratio: 11 (ref 9–20)
BUN: 18 mg/dL (ref 6–24)
Bilirubin Total: 0.2 mg/dL (ref 0.0–1.2)
Creatinine, Ser: 1.57 mg/dL — ABNORMAL HIGH (ref 0.76–1.27)
GFR calc Af Amer: 55 mL/min/{1.73_m2} — ABNORMAL LOW (ref >59)
GFR calc non Af Amer: 48 mL/min/{1.73_m2} — ABNORMAL LOW (ref >59)
GLUCOSE: 106 mg/dL — AB (ref 65–99)
Globulin, Total: 3.8 g/dL (ref 1.5–4.5)
Total Protein: 7.7 g/dL (ref 6.0–8.5)

## 2014-12-24 LAB — IGG, IGA, IGM
IGA/IMMUNOGLOBULIN A, SERUM: 103 mg/dL (ref 90–386)
IGM (IMMUNOGLOBULIN M), SRM: 14 mg/dL — AB (ref 20–172)
IgG (Immunoglobin G), Serum: 2244 mg/dL — ABNORMAL HIGH (ref 700–1600)

## 2014-12-24 LAB — SJOGREN'S SYNDROME ANTIBODS(SSA + SSB)
ENA SSA (RO) Ab: <0.2 AI (ref 0.0–0.9)
ENA SSB (LA) Ab: <0.2 AI (ref 0.0–0.9)

## 2014-12-24 LAB — HEMOGLOBIN A1C
Est. average glucose Bld gHb Est-mCnc: 157 mg/dL
HEMOGLOBIN A1C: 7.1 % — AB (ref 4.8–5.6)

## 2014-12-24 LAB — SEDIMENTATION RATE: SED RATE: 7 mm/h (ref 0–30)

## 2014-12-24 LAB — IGG 4: IGG 4: 11 mg/dL (ref 1–291)

## 2014-12-24 LAB — ANTI-DNA ANTIBODY, DOUBLE-STRANDED: dsDNA Ab: 1 IU/mL (ref 0–9)

## 2014-12-24 LAB — IGE: IGE (IMMUNOGLOBULIN E), SERUM: 89 [IU]/mL (ref 0–100)

## 2015-01-01 ENCOUNTER — Encounter: Payer: Self-pay | Admitting: Neurology

## 2015-01-01 ENCOUNTER — Ambulatory Visit (INDEPENDENT_AMBULATORY_CARE_PROVIDER_SITE_OTHER): Payer: Medicare Other | Admitting: Neurology

## 2015-01-01 VITALS — BP 185/90 | HR 69 | Ht 69.0 in | Wt 383.0 lb

## 2015-01-01 DIAGNOSIS — L986 Other infiltrative disorders of the skin and subcutaneous tissue: Secondary | ICD-10-CM

## 2015-01-01 DIAGNOSIS — L988 Other specified disorders of the skin and subcutaneous tissue: Secondary | ICD-10-CM | POA: Diagnosis not present

## 2015-01-01 NOTE — Progress Notes (Signed)
[facility]    Provider: Dr Jaynee Eagles Referring Provider: Okey Regal, OD Primary Care Physician: Glenda Chroman., MD  CC: Acute diplopia  Interval Update 01/01/2015: FAROUK VIVERO is a 59 y.o. male here as a referral from Dr. Hassell Done for Acute diplopia. He has a PMHx of morbid obesity, OSA on cpap, HTN. Diplopia was acute in onset. MRI shows diffusely abnormal superficial face and periorbital soft tissue appearance. The bilateral parotid and lacrimal glands are affected and are diffusely abnormal. The lateral recti muscles (L>R) are also enlarged which is the likely cause of patient's diplopia and proptosis.Suspicious for chronic systemic inflammatory/autoimmune disease in this patient.  He saw endocrinology since last appointment and was placed on synthroid. He is on 70m a day of steroids which significantly improved symptoms even in the first few days. He is monitoring his glucose appropriately. His face is significantly improved, diplopia resolved since starting the steroids. He cancelled his rheumtology appointment and is hoping to see Dr. HTrudie Reedinstead. Will send over all notes and labwork. At this point, still unclear of the diagnosis except for elevated Igg with normal Igg4 subclass. Will taper steroids 180ma week until at 1040maily then will hold. If symptoms worsen, will call me. His double vision has resolved. He feels better on the steroids.   Labs completed include: Elevated total IgG at 2244 (upper limit normal 1600 mg/dL), normal IgA and IgM and IgE and IgG subclass 4  Normal complement level CIII and C4, elevated rheumatoid factor and 42.5 with normal CCP antibodies but otherwise normal RNP antibodies smooth muscle antibodies chromatin antibodies Sjogren's antibodies and double-stranded DNA, CBC with differential unremarkable with only slightly elevated absolute neutrophils at 7.3, IFE showed a polyclonal gammopathy, sedimentation rate was normal at 7,  hemoglobin A1c 7.1, CMP with chronic kidney disease creatinine 1.57, angiotensin-converting enzyme within normal limits, HIV nonreactive, C8 Anka and p-ANCA normal, rheumatoid factor 54.4, high sensitivity CRP 6.4 (upper limit normal 3 mg/L), anti-scleroderma antibody within normal limits, RPR nonreactive, TSH 4.4,   Interval update 12/19/2014: The double vision is still there when he looks to the far left. The left side of the face is fuller, feels like a tennis ball in his cheeks. He took some left over prednisone for a few days and he feels better. Discussed the biopsy which showed diffuse lymphocytic infiltration of the gland, tissues of his face as well as bilateral lateral right rectus muscles. Extensive lab work including ANA with reflex, angiotensin-converting enzyme, HIV, multiple myeloma, pan-Anka, CRP, anti-scleroderma antibody, RPR were all unremarkable. His rheumatoid factor did return high at 54. Reviewed imaging and imaging results. Also discussed recommendations from Dr. GroCarolynn Sayersat we consider a very rare disorder called IgG4 related disease. We'll test for IgG, IgA, IgM, IgG, IgE and IgG4 specifically and other labs today. Rheumatology and endocrinology appointments have been scheduled. We'll also try and schedule him for parotid biopsy per Dr. GroZenia Residescommendations. Today we'll start steroids. Patient has diabetes but he remains untreated because his hemoglobin A1c is less than 7. Discussed my concerns about the side effects of steroids especially elevated glucose and the possibility for diabetic ketoacidosis. Patient and wife very much would like to start steroids today. His wife is in attendance today. Discussed other side effects including insomnia, mania, high blood pressure, increased appetite, edema. Have asked patient to check his glucose 4 times daily. We'll start him at a high dose of 40 mg and if glucose elevated significantly we'll decrease him immediately.  11/29/2014: EASTEN MACEACHERN is a 59 y.o. male here as a referral from Dr. Hassell Done for Acute diplopia. He has a PMHx of morbid obesity, OSA on cpap, HTN. Diplopia was acute in onset. Started Saturday, he had a severe sinus headache. He took his daughter to work. While he was driving, he noticed the double vision. He was seeing the cars and roads "side by side", seeing double. The double vision is worse farther away and looking straight and to the left. He has had exopthalmos for a long time but today they notice L>>R proptosis that is completely new. The double vision is only when he has both eyes open, it resolves with one eye closed. No pain on eye movement. Eye is dry, itchy. No headache since Saturday. Closing one eye makes the double vision go away. He denies any other focal neurologic deficits. He endorses an extremely high cpap pressure that makes his eyes "pop out" in the morning with resolution after stopping the cpap. No previous incidents of diplopia before.   MRI shows diffusely abnormal superficial face and periorbital soft tissue appearance. The bilateral parotid and lacrimal glands are affected and are diffusely abnormal. 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     Reviewed notes, labs and imaging from outside physicians, which showed:  Personally reviewed images and agree with the following. Personally discussed with neuroradiogy:  IMPRESSION: 1. Contrast could not be administered due to inability to obtain IV access. 2. Dominant finding is diffusely abnormal superficial face and periorbital soft tissue appearance. The bilateral parotid and lacrimal glands are affected and are diffusely abnormal. 3. Bilateral exophthalmos and left worse than right lateral rectus muscle enlargement. 4. Negative non contrast MRI and MRA  appearance of the brain. 5. The imaging appearance of #2 and #3 is nonspecific. Favor a systemic inflammatory process as the unifying diagnosis in this case. Dermatology consultation may be valuable. A combination of Thyroid Orbitopathy and Thyroid Acropachy is considered (such as due to Graves disease), but the pattern of extraocular muscle involvement seen here would be very atypical. An unusual infection or unusual presentation of malignancy such as lymphoma also should be considered.  Patient uses full CPAP mask with a very high 21/16 pressure. Morbidly obese at 384.6 pounds. Records reviewed by my eye doctor opt[facility] of [place] in [place] show patient was seen about his double vision. No visual floaters or light flashes. No pain. Did have a sinus headache before onset. 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    Review of Systems: Patient complains of symptoms per HPI as well as the following symptoms: Double vision, eye pain, headache, ringing in ears, allergies. Pertinent negatives per HPI. All others negative.   History   Social History  . Marital Status: Married    Spouse Name: N/A  . Number of Children: 2  . Years of Education: 12   Occupational History  . Retired     Social History Main Topics  . Smoking status: Never Smoker   . Smokeless tobacco: Not on file  . Alcohol Use: No  . Drug Use: No  . Sexual Activity: Not on file   Other Topics Concern  . Not on file   Social History Narrative   Lives at home with wife and 2 children   Caffeine use: 2-3 cups  coffee per week       Family History  Problem Relation Age of Onset  . Colon cancer Father   . Hypertension Father   . Hypertension Mother   . Heart attack Paternal Grandfather   . Heart attack Maternal Grandfather     Past Medical History  Diagnosis Date  . Hypertension   . Diabetes   . Sleep apnea   . Arthritis     Past Surgical History  Procedure Laterality Date  .  Lasik Bilateral 2008  . Knee arthroscopy Bilateral     Current Outpatient Prescriptions  Medication Sig Dispense Refill  . allopurinol (ZYLOPRIM) 300 MG tablet Take 150 mg by mouth daily.    Marland Kitchen amitriptyline (ELAVIL) 25 MG tablet Take 25 mg by mouth as needed for sleep.    . cetirizine (ZYRTEC) 10 MG tablet Take 10 mg by mouth daily.    . fexofenadine (ALLEGRA) 180 MG tablet Take 180 mg by mouth daily.    Marland Kitchen gabapentin (NEURONTIN) 800 MG tablet Take 400 mg by mouth daily.    . metoprolol succinate (TOPROL-XL) 100 MG 24 hr tablet Take 150 mg by mouth daily. Takes 1.5 tablets each day    . ONETOUCH VERIO test strip     . predniSONE (DELTASONE) 10 MG tablet Take 4 tablets (40 mg total) by mouth daily with breakfast. 120 tablet 0  . tiZANidine (ZANAFLEX) 4 MG tablet Take 4 mg by mouth daily. At night    . triamterene-hydrochlorothiazide (MAXZIDE-25) 37.5-25 MG per tablet Take 0.5 tablets by mouth daily.     No current facility-administered medications for this visit.    Allergies as of 01/01/2015 - Review Complete 12/19/2014  Allergen Reaction Noted  . Codeine  11/29/2014    Vitals: There were no vitals taken for this visit. Last Weight:  Wt Readings from Last 1 Encounters:  12/19/14 389 lb (176.449 kg)   Last Height:   Ht Readings from Last 1 Encounters:  12/19/14 $RemoveB'5\' 9"'[id number]$  (1.753 m)   Eyes: Bilateral conjunctival injection left > right, exophthalmos L>R much improved  Cranial Nerves:  The pupils are equal, round, and reactive to light.Unable to visualize fundi, attempted. Left >> right proptosis. Inability to completely abduct left eye - greatly improved. Visual fields are full to finger confrontation. Trigeminal sensation is intact and the muscles of mastication are normal. The face is symmetric. The palate elevates in the midline. Hearing intact. Voice is normal. Shoulder shrug is normal. The tongue has normal motion without fasciculations.  Assessment/Plan: 59 year old patient  with acute onset diplopia. Exam shows incomplete abduction of the left eye likely due to his enlarged lateralis rectus, left greater than right conjunctival injection and eye proptosis.   MRI shows diffusely abnormal superficial face and periorbital soft tissue appearance. The bilateral parotid and lacrimal glands are affected and are diffusely abnormal. The lateral recti muscles (L>R) are also enlarged which is the likely cause of patient's diplopia and proptosis.  Suspicious for chronic systemic inflammatory/autoimmune disease in this patient. Skin biopsy with dermatologist showed lymphocytic infiltrate that was nonspecific.  Patient's symptoms significantly improved after a few weeks on steroids.   Very much appreciate Dr. Zenia Resides recommendation that we consider a very rare disorder called IgG4 related disease. We will test for IgG, IgA, IgM, IgG, IgE and IgG4 specifically and other labs today(see results in HPI section). IGG elevated but subtype4 wnl. Will test for IGG1,2 and 3 today. Rheumatology and endocrinology appointments have been scheduled.  Will taper steroids.  Patient will follow up in 4 weeks.  Sarina Ill, MD  [facility] [address] [place], [place] [address]  Phone [phone] Fax [phone]  A total of 45 minutes was spent face-to-face with this patient. Over half this time was spent on counseling patient on the Igg-related rare disease diagnosis and different diagnostic and therapeutic options available.

## 2015-01-02 ENCOUNTER — Ambulatory Visit: Payer: Medicare Other | Admitting: Neurology

## 2015-01-02 LAB — IGG 1, 2, 3, AND 4
IGG 4: 14 mg/dL (ref 1–291)
IGG, SUBCLASS 1: 348 mg/dL — AB (ref 422–1292)
IgG (Immunoglobin G), Serum: 1514 mg/dL (ref 700–1600)
IgG, Subclass 2: 1010 mg/dL — ABNORMAL HIGH (ref 117–747)
IgG, Subclass 3: 27 mg/dL — ABNORMAL LOW (ref 41–129)

## 2015-01-08 ENCOUNTER — Telehealth: Payer: Self-pay | Admitting: Neurology

## 2015-01-08 NOTE — Telephone Encounter (Signed)
Patient called requesting lab results. Please call and advise. Patient can be reached at [phone].

## 2015-01-10 ENCOUNTER — Other Ambulatory Visit: Payer: Self-pay | Admitting: Neurology

## 2015-01-10 ENCOUNTER — Encounter: Payer: Self-pay | Admitting: *Deleted

## 2015-01-10 ENCOUNTER — Telehealth: Payer: Self-pay | Admitting: Neurology

## 2015-01-10 DIAGNOSIS — R897 Abnormal histological findings in specimens from other organs, systems and tissues: Secondary | ICD-10-CM

## 2015-01-10 NOTE — Telephone Encounter (Signed)
Spoke with patient. Elevated igg subclas 2. Will refer to ENt for possible parotid biopsy.

## 2015-01-15 ENCOUNTER — Other Ambulatory Visit (HOSPITAL_COMMUNITY): Payer: Self-pay | Admitting: Otolaryngology

## 2015-01-15 ENCOUNTER — Telehealth: Payer: Self-pay

## 2015-01-15 DIAGNOSIS — J342 Deviated nasal septum: Secondary | ICD-10-CM

## 2015-01-15 DIAGNOSIS — K119 Disease of salivary gland, unspecified: Secondary | ICD-10-CM

## 2015-01-15 NOTE — Telephone Encounter (Signed)
Pt's wife is calling stating that the pt's rheumatology appointment was changed to 7/15, and Dr. Jaynee Eagles needs to see the patient after the rheumatology appt. Pt is requesting an appt after 7/15 because his appt now is on 7/11. Please work the patient in as able.

## 2015-01-15 NOTE — Telephone Encounter (Signed)
Left VM for pt to call back to schedule a 74min f/u appt w/ Dr. Jaynee Eagles after 02/01/15. You can place him in a office visit slot or combine 2-39min slots if he calls back, thank you!

## 2015-01-18 ENCOUNTER — Ambulatory Visit (HOSPITAL_COMMUNITY)
Admission: RE | Admit: 2015-01-18 | Discharge: 2015-01-18 | Disposition: A | Discharge status: Home / Self Care | Payer: Medicare Other | Source: Ambulatory Visit | Attending: Otolaryngology | Admitting: Otolaryngology

## 2015-01-18 DIAGNOSIS — K119 Disease of salivary gland, unspecified: Secondary | ICD-10-CM | POA: Insufficient documentation

## 2015-01-18 DIAGNOSIS — J342 Deviated nasal septum: Secondary | ICD-10-CM

## 2015-01-28 ENCOUNTER — Ambulatory Visit: Payer: Medicare Other | Admitting: Neurology

## 2015-01-30 ENCOUNTER — Other Ambulatory Visit (HOSPITAL_COMMUNITY): Payer: Self-pay | Admitting: Otolaryngology

## 2015-01-30 DIAGNOSIS — R599 Enlarged lymph nodes, unspecified: Secondary | ICD-10-CM

## 2015-01-31 ENCOUNTER — Other Ambulatory Visit: Payer: Self-pay | Admitting: Radiology

## 2015-01-31 ENCOUNTER — Ambulatory Visit (HOSPITAL_COMMUNITY)
Admission: RE | Admit: 2015-01-31 | Discharge: 2015-01-31 | Disposition: A | Discharge status: Home / Self Care | Payer: Medicare Other | Source: Ambulatory Visit | Attending: Otolaryngology | Admitting: Otolaryngology

## 2015-01-31 ENCOUNTER — Encounter (HOSPITAL_COMMUNITY): Payer: Self-pay

## 2015-01-31 DIAGNOSIS — R599 Enlarged lymph nodes, unspecified: Secondary | ICD-10-CM | POA: Insufficient documentation

## 2015-01-31 DIAGNOSIS — R590 Localized enlarged lymph nodes: Secondary | ICD-10-CM | POA: Insufficient documentation

## 2015-01-31 DIAGNOSIS — G4730 Sleep apnea, unspecified: Secondary | ICD-10-CM | POA: Insufficient documentation

## 2015-01-31 DIAGNOSIS — E119 Type 2 diabetes mellitus without complications: Secondary | ICD-10-CM | POA: Insufficient documentation

## 2015-01-31 DIAGNOSIS — I10 Essential (primary) hypertension: Secondary | ICD-10-CM | POA: Insufficient documentation

## 2015-01-31 LAB — CBC
HCT: 45.8 % (ref 39.0–52.0)
HEMOGLOBIN: 15.9 g/dL (ref 13.0–17.0)
MCH: 31.7 pg (ref 26.0–34.0)
MCHC: 34.7 g/dL (ref 30.0–36.0)
MCV: 91.2 fL (ref 78.0–100.0)
PLATELETS: 157 10*3/uL (ref 150–400)
RBC: 5.02 MIL/uL (ref 4.22–5.81)
RDW: 13.1 % (ref 11.5–15.5)
WBC: 8.3 10*3/uL (ref 4.0–10.5)

## 2015-01-31 LAB — APTT: aPTT: 23 seconds — ABNORMAL LOW (ref 24–37)

## 2015-01-31 LAB — PROTIME-INR
INR: 0.97 (ref 0.00–1.49)
Prothrombin Time: 13.1 seconds (ref 11.6–15.2)

## 2015-01-31 MED ORDER — LIDOCAINE-EPINEPHRINE 1 %-1:100000 IJ SOLN
INTRAMUSCULAR | Status: AC
  Filled 2015-01-31: qty 1

## 2015-01-31 MED ORDER — GELATIN ABSORBABLE 12-7 MM EX MISC
CUTANEOUS | Status: DC
Start: 2015-01-31 — End: 2015-01-31
  Filled 2015-01-31: qty 1

## 2015-01-31 MED ORDER — SODIUM CHLORIDE 0.9 % IV SOLN: Freq: Once | INTRAVENOUS | Status: DC

## 2015-01-31 NOTE — Procedures (Signed)
Technically successful US guided FNA biopsy of dominant right sided parotid gland lymph node.  No immediate complications.

## 2015-01-31 NOTE — H&P (Signed)
Chief Complaint: Enlarged parotid lymph nodes  Referring Physician(s): Crossley,James J  History of Present Illness: Patrick Kane is a 59 y.o. male   Pt suffered blurred vision in Left eye May 2016 Work up ensued Favor rheumatoid abnormality per pt Noted B parotid lymphadenopathy on recent US Now scheduled for B of Rt parotid LN   Past Medical History  Diagnosis Date  . Hypertension   . Diabetes   . Sleep apnea   . Arthritis     Past Surgical History  Procedure Laterality Date  . Lasik Bilateral 2008  . Knee arthroscopy Bilateral     Allergies: Codeine  Medications: Prior to Admission medications   Medication Sig Start Date End Date Taking? Authorizing Provider  allopurinol (ZYLOPRIM) 300 MG tablet Take 150 mg by mouth daily. 11/27/14  Yes Historical Provider, MD  amitriptyline (ELAVIL) 25 MG tablet Take 25 mg by mouth as needed for sleep.   Yes Historical Provider, MD  gabapentin (NEURONTIN) 800 MG tablet Take 400-800 mg by mouth daily.  10/16/14  Yes Historical Provider, MD  glimepiride (AMARYL) 1 MG tablet Take 1 mg by mouth daily. 12/21/14  Yes Historical Provider, MD  levothyroxine (SYNTHROID, LEVOTHROID) 50 MCG tablet Take 50 mcg by mouth daily. 12/21/14  Yes Historical Provider, MD  metoprolol succinate (TOPROL-XL) 100 MG 24 hr tablet Take 150 mg by mouth daily. Takes 1.5 tablets each day 10/23/14  Yes Historical Provider, MD  [facility] DELICA LANCETS 32I MISC 1 each by Other route 3 (three) times daily.  12/21/14  Yes Historical Provider, MD  Glory Rosebush VERIO test strip 1 each by Other route 3 (three) times daily.  09/25/14  Yes Historical Provider, MD  predniSONE (DELTASONE) 5 MG tablet Take 5 mg by mouth daily.   Yes Historical Provider, MD  tiZANidine (ZANAFLEX) 4 MG tablet Take 4 mg by mouth at bedtime as needed for muscle spasms.  11/27/14  Yes Historical Provider, MD  triamterene-hydrochlorothiazide (MAXZIDE-25) 37.5-25 MG per tablet Take 0.5 tablets by mouth  daily. 10/23/14  Yes Historical Provider, MD     Family History  Problem Relation Age of Onset  . Colon cancer Father   . Hypertension Father   . Hypertension Mother   . Heart attack Paternal Grandfather   . Heart attack Maternal Grandfather     History   Social History  . Marital Status: Married    Spouse Name: N/A  . Number of Children: 2  . Years of Education: 12   Occupational History  . Retired     Social History Main Topics  . Smoking status: Never Smoker   . Smokeless tobacco: Not on file  . Alcohol Use: No  . Drug Use: No  . Sexual Activity: Not on file   Other Topics Concern  . None   Social History Narrative   Lives at home with wife and 2 children   Caffeine use: 2-3 cups coffee per week        Review of Systems: A 12 point ROS discussed and pertinent positives are indicated in the HPI above.  All other systems are negative.  Review of Systems  Constitutional: Negative for fever, activity change and fatigue.  Respiratory: Negative for shortness of breath.   Neurological: Negative for weakness.  Psychiatric/Behavioral: Negative for behavioral problems and confusion.    Vital Signs: BP 175/95 mmHg  Pulse 95  Temp(Src) 98.4 F (36.9 C)  Resp 18  Ht 5\' 9"  (1.753 m)  Wt 384 lb (  174.181 kg)  BMI 56.68 kg/m2  SpO2 99%  Physical Exam  Constitutional: He is oriented to person, place, and time. He appears well-nourished.  Eyes: EOM are normal.  exopthalmos  Neck: Normal range of motion.  Cardiovascular: Normal rate, regular rhythm and normal heart sounds.   Pulmonary/Chest: Effort normal and breath sounds normal. He has no wheezes.  Abdominal: Soft. Bowel sounds are normal.  Musculoskeletal: Normal range of motion.  Neurological: He is alert and oriented to person, place, and time.  Skin: Skin is warm and dry.  Psychiatric: He has a normal mood and affect. His behavior is normal. Judgment and thought content normal.  Nursing note and vitals  reviewed.   Mallampati Score:  MD Evaluation Airway: WNL Heart: WNL Abdomen: WNL Chest/ Lungs: WNL ASA  Classification: 3 Mallampati/Airway Score: Two  Imaging: US Soft Tissue Head/neck  01/18/2015   CLINICAL DATA:  Salivary gland disease.  EXAM: ULTRASOUND OF HEAD/NECK SOFT TISSUES  TECHNIQUE: Ultrasound examination of the head and neck soft tissues was performed in the area of clinical concern.  COMPARISON:  MRI 11/29/2014  FINDINGS: There are an oval-shaped structure in the right parotid tissue that measures 1.8 x 0.8 x 1.0 cm. This appears to have a fatty hilum and compatible with an intra-parotid lymph node. This also corresponds with the previous MRI findings. There is a second smaller lymph node in the right parotid tissue that measures 0.4 cm in short axis.  Images of the left parotid grand also demonstrate a lymph node measuring 1.6 x 0.7 x 0.8 cm. This has a normal fatty hilum. There is a second small nodular structure in the left thyroid lobe which has characteristics of a lymph node and measures 0.4 cm in the short axis.  IMPRESSION: Evidence for bilateral intra-parotid lymph nodes.   Electronically Signed   By: Markus Daft M.D.   On: 01/18/2015 16:20    Labs:  CBC:  Recent Labs  11/29/14 1511 12/20/14 1326  WBC 7.5 9.2  HCT 47.8 47.1    COAGS: No results for input(s): INR, APTT in the last 8760 hours.  BMP:  Recent Labs  11/29/14 1511 11/29/14 1715 12/19/14 1434  NA 140  --  CANCELED  K 4.3  --  CANCELED  CL 96*  --  CANCELED  CO2 19  --  CANCELED  GLUCOSE 104*  --  106*  BUN 18  --  18  CALCIUM 9.8  --  CANCELED  CREATININE 1.61* 1.73* 1.57*  GFRNONAA 46* 41* 48*  GFRAA 53* 48* 55*    LIVER FUNCTION TESTS:  Recent Labs  11/29/14 1511 12/04/14 1431 12/19/14 1434 12/20/14 1325  BILITOT 0.6  --  0.2  --   AST 23  --  21  --   ALT 26  --  26  --   ALKPHOS 91  --  CANCELED  --   PROT 8.2 8.0 7.7 8.2    TUMOR MARKERS: No results for input(s):  AFPTM, CEA, CA199, CHROMGRNA in the last 8760 hours.  Assessment and Plan:  B parotid LAN - enlargement Rt more than Lt Scheduled for biopsy of Rt parotid LN Risks and Benefits discussed with the patient including, but not limited to bleeding, infection, damage to adjacent structures or low yield requiring additional tests. All of the patient's questions were answered, patient is agreeable to proceed. Consent signed and in chart.   Thank you for this interesting consult.  I greatly enjoyed meeting Patrick Kane and  look forward to participating in their care.  Signed: Adarrius Graeff A 01/31/2015, 1:28 PM   I spent a total of  30 Minutes   in face to face in clinical consultation, greater than 50% of which was counseling/coordinating care for Rt parotid LN bx

## 2015-02-13 ENCOUNTER — Ambulatory Visit (INDEPENDENT_AMBULATORY_CARE_PROVIDER_SITE_OTHER): Payer: Medicare Other | Admitting: Neurology

## 2015-02-13 ENCOUNTER — Encounter: Payer: Self-pay | Admitting: Neurology

## 2015-02-13 VITALS — BP 182/92 | HR 76 | Temp 98.2°F | Ht 69.0 in | Wt 383.6 lb

## 2015-02-13 DIAGNOSIS — L988 Other specified disorders of the skin and subcutaneous tissue: Secondary | ICD-10-CM

## 2015-02-13 DIAGNOSIS — L986 Other infiltrative disorders of the skin and subcutaneous tissue: Secondary | ICD-10-CM

## 2015-02-13 NOTE — Progress Notes (Addendum)
[facility]    Provider:  Dr Jaynee Eagles Referring Provider: Glenda Chroman., MD Primary Care Physician:  Glenda Chroman., MD  Provider: Dr Jaynee Eagles Referring Provider: Okey Regal, OD Primary Care Physician: Glenda Chroman., MD  CC: Acute diplopia  Interval Update 01/01/2015: Patrick Kane is a 59 y.o. male here as a referral from Dr. Hassell Done for Acute diplopia. He has a PMHx of morbid obesity, OSA on cpap, HTN. Diplopia was acute in onset. MRI shows diffusely abnormal superficial face and periorbital soft tissue appearance. The bilateral parotid and lacrimal glands are affected and are diffusely abnormal. The lateral recti muscles (L>R) are also enlarged which is the likely cause of patient's diplopia and proptosis.Suspicious for chronic systemic inflammatory/autoimmune disease in this patient.  Steroids improved his double vision and the fullness in his face significantly and Diplopia resolved. However when he decreased to 46m prednisone daily his symptoms worsened. Will keep him on 145mfor now. He is scheduled with Rheumatology soon.  Had parotid biopsy, lymph node fine-needle aspiration of the inter-glandular parotid, which showed  mixed lymphoid population, the cytologic features favor a reactive process.   Interval Update 01/01/2015: Patrick AMSDENs a [age].o. male here as a referral from Dr. MaHassell Doneor Acute diplopia. He has a PMHx of morbid obesity, OSA on cpap, HTN. Diplopia was acute in onset. MRI shows diffusely abnormal superficial face and periorbital soft tissue appearance. The bilateral parotid and lacrimal glands are affected and are diffusely abnormal. The lateral recti muscles (L>R) are also enlarged which is the likely cause of patient's diplopia and proptosis.Suspicious for chronic systemic inflammatory/autoimmune disease in this patient.  He saw endocrinology since last appointment and was placed on synthroid. He is on 4020m day of steroids which  significantly improved symptoms even in the first few days. He is monitoring his glucose appropriately. His face is significantly improved, diplopia resolved since starting the steroids. He cancelled his rheumtology appointment and is hoping to see Dr. HawTrudie Reedstead. Will send over all notes and labwork. At this point, still unclear of the diagnosis except for elevated Igg with normal Igg4 subclass. Will taper steroids 93m22mweek until at 93mg39mly then will hold. If symptoms worsen, will call me. His double vision has resolved. He feels better on the steroids.   Labs completed include: Elevated total IgG at 2244 (upper limit normal 1600 mg/dL), normal IgA and IgM and IgE and IgG subclass 4  Normal complement level CIII and C4, elevated rheumatoid factor and 42.5 with normal CCP antibodies but otherwise normal RNP antibodies smooth muscle antibodies chromatin antibodies Sjogren's antibodies and double-stranded DNA, CBC with differential unremarkable with only slightly elevated absolute neutrophils at 7.3, IFE showed a polyclonal gammopathy, sedimentation rate was normal at 7, hemoglobin A1c 7.1, CMP with chronic kidney disease creatinine 1.57, angiotensin-converting enzyme within normal limits, HIV nonreactive, C8 Anka and p-ANCA normal, rheumatoid factor 54.4, high sensitivity CRP 6.4 (upper limit normal 3 mg/L), anti-scleroderma antibody within normal limits, RPR nonreactive, TSH 4.4,   Interval update 12/19/2014: The double vision is still there when he looks to the far left. The left side of the face is fuller, feels like a tennis ball in his cheeks. He took some left over prednisone for a few days and he feels better. Discussed the biopsy which showed diffuse lymphocytic infiltration of the gland, tissues of his face as well as bilateral lateral right rectus muscles. Extensive lab work including ANA with reflex, angiotensin-converting enzyme, HIV, multiple  myeloma, pan-Anka, CRP, anti-scleroderma  antibody, RPR were all unremarkable. His rheumatoid factor did return high at 54. Reviewed imaging and imaging results. Also discussed recommendations from Dr. Carolynn Sayers that we consider a very rare disorder called IgG4 related disease. We'll test for IgG, IgA, IgM, IgG, IgE and IgG4 specifically and other labs today. Rheumatology and endocrinology appointments have been scheduled. We'll also try and schedule him for parotid biopsy per Dr. Zenia Resides recommendations. Today we'll start steroids. Patient has diabetes but he remains untreated because his hemoglobin A1c is less than 7. Discussed my concerns about the side effects of steroids especially elevated glucose and the possibility for diabetic ketoacidosis. Patient and wife very much would like to start steroids today. His wife is in attendance today. Discussed other side effects including insomnia, mania, high blood pressure, increased appetite, edema. Have asked patient to check his glucose 4 times daily. We'll start him at a high dose of 40 mg and if glucose elevated significantly we'll decrease him immediately.    11/29/2014: Patrick Kane is a 59 y.o. male here as a referral from Dr. Hassell Done for Acute diplopia. He has a PMHx of morbid obesity, OSA on cpap, HTN. Diplopia was acute in onset. Started Saturday, he had a severe sinus headache. He took his daughter to work. While he was driving, he noticed the double vision. He was seeing the cars and roads "side by side", seeing double. The double vision is worse farther away and looking straight and to the left. He has had exopthalmos for a long time but today they notice L>>R proptosis that is completely new. The double vision is only when he has both eyes open, it resolves with one eye closed. No pain on eye movement. Eye is dry, itchy. No headache since Saturday. Closing one eye makes the double vision go away. He denies any other focal neurologic deficits. He endorses an extremely high cpap pressure that  makes his eyes "pop out" in the morning with resolution after stopping the cpap. No previous incidents of diplopia before.   MRI shows diffusely abnormal superficial face and periorbital soft tissue appearance. The bilateral parotid and lacrimal glands are affected and are diffusely abnormal. 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     Reviewed notes, labs and imaging from outside physicians, which showed:  Personally reviewed images and agree with the following. Personally discussed with neuroradiogy:  IMPRESSION: 1. Contrast could not be administered due to inability to obtain IV access. 2. Dominant finding is diffusely abnormal superficial face and periorbital soft tissue appearance. The bilateral parotid and lacrimal glands are affected and are diffusely abnormal. 3. Bilateral exophthalmos and left worse than right lateral rectus muscle enlargement. 4. Negative non contrast MRI and MRA appearance of the brain. 5. The imaging appearance of #2 and #3 is nonspecific. Favor a systemic inflammatory process as the unifying diagnosis in this case. Dermatology consultation may be valuable. A combination of Thyroid Orbitopathy and Thyroid Acropachy is considered (such as due to Graves disease), but the pattern of extraocular muscle involvement seen here would be very atypical. An unusual infection or unusual presentation of malignancy such as lymphoma also should be considered.  Patient uses full CPAP mask with a very high 21/16 pressure. Morbidly obese at 384.6 pounds. Records reviewed by my eye doctor optometry of [place] in  [place] show patient was seen about his double vision. No visual floaters or light flashes. No pain. Did have a sinus headache before onset. 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    Review of Systems: Patient complains of symptoms per HPI as well as the following symptoms: Double vision, eye pain, headache, ringing in ears, allergies. Pertinent negatives per HPI. All others negative.  History   Social History  . Marital Status: Married    Spouse Name: N/A  . Number of Children: 2  . Years of Education: 12   Occupational History  . Retired     Social History Main Topics  . Smoking status: Never Smoker   . Smokeless tobacco: Not on file  . Alcohol Use: No  . Drug Use: No  . Sexual Activity: Not on file   Other Topics Concern  . Not on file   Social History Narrative   Lives at home with wife and 2 children   Caffeine use: 2-3 cups coffee per week       Family History  Problem Relation Age of Onset  . Colon cancer Father   . Hypertension Father   . Hypertension Mother   . Heart attack Paternal Grandfather   . Heart attack Maternal Grandfather     Past Medical History  Diagnosis Date  . Hypertension   . Diabetes   . Sleep apnea   . Arthritis     Past Surgical History  Procedure Laterality Date  . Lasik Bilateral 2008  . Knee arthroscopy Bilateral   . Biospy Right 2016    Parotid Biopsy     Current Outpatient Prescriptions  Medication Sig Dispense Refill  . allopurinol (ZYLOPRIM) 300 MG tablet Take 150 mg by mouth daily.    Marland Kitchen amitriptyline (ELAVIL) 25 MG tablet Take 25 mg by mouth as needed for sleep.    Marland Kitchen gabapentin (NEURONTIN) 800 MG tablet Take 400-800 mg by mouth daily.     Marland Kitchen glimepiride (AMARYL) 1 MG tablet Take 1 mg by mouth daily.    Marland Kitchen levothyroxine (SYNTHROID, LEVOTHROID) 50 MCG tablet Take 50 mcg by mouth daily.    . metoprolol succinate (TOPROL-XL) 100 MG 24 hr tablet Take 150 mg by mouth daily. Takes 1.5 tablets each day    . ONETOUCH DELICA LANCETS 59R MISC 1 each by Other route 3 (three) times daily.     Glory Rosebush VERIO test strip 1 each by Other route 3 (three) times daily.     . predniSONE (DELTASONE)  5 MG tablet Take 5 mg by mouth daily.    Marland Kitchen tiZANidine (ZANAFLEX) 4 MG tablet Take 4 mg by mouth at bedtime as needed for muscle spasms.     Marland Kitchen triamterene-hydrochlorothiazide (MAXZIDE-25) 37.5-25 MG per tablet Take 0.5 tablets by mouth daily.     No current facility-administered medications for this visit.    Allergies as of 02/13/2015 - Review Complete 02/13/2015  Allergen Reaction Noted  . Codeine  11/29/2014    Vitals: BP 182/92 mmHg  Pulse 76  Temp(Src) 98.2 F (36.8 C) (Oral)  Ht 5' 9"  (1.753 m)  Wt 383 lb 9.6 oz (174 kg)  BMI 56.62 kg/m2 Last Weight:  Wt Readings from Last 1 Encounters:  02/13/15 383 lb 9.6 oz (174 kg)   Last Height:   Ht Readings from Last 1 Encounters:  02/13/15 5' 9"  (1.753 m)    Cranial Nerves:  The pupils are equal, round, and reactive to light.Unable to visualize fundi,  attempted. Left >> right proptosis. Inability to completely abduct left eye - greatly improved. Visual fields are full to finger confrontation. Trigeminal sensation is intact and the muscles of mastication are normal. The face is symmetric. The palate elevates in the midline. Hearing intact. Voice is normal. Shoulder shrug is normal. The tongue has normal motion without fasciculations.  Assessment/Plan: 59 year old patient with acute onset diplopia. Exam shows incomplete abduction of the left eye likely due to his enlarged lateralis rectus, left greater than right conjunctival injection and eye proptosis. These symptoms greatly improved on steroids.   MRI shows diffusely abnormal superficial face and periorbital soft tissue appearance. The bilateral parotid and lacrimal glands are affected and are diffusely abnormal. The lateral recti muscles (L>R) are also enlarged which is the likely cause of patient's diplopia and proptosis.  Suspicious for chronic systemic inflammatory/autoimmune disease in this patient. Skin biopsy with dermatologist showed lymphocytic infiltrate that was  nonspecific. Had parotid biopsy, lymph node fine-needle aspiration of the inter-glandular parotid, which showed  mixed lymphoid population.  Patient's symptoms significantly improved after a few weeks on steroids.   Very much appreciate Dr. Zenia Resides recommendation that we consider a very rare disorder called IgG4 related disease. We will test for IgG, IgA, IgM, IgG, IgE and IgG4 specifically and other labs today(see results in HPI section). IGG elevated but subtype4 wnl. Will test for IGG1,2 and 3 today. Rheumatology and endocrinology appointments have been scheduled. Will taper steroids.   Patient will follow up in 4 weeks.  Steroids improved his double vision and the fullness in his face significantly and Diplopia resolved. However when he decreased to 48m prednisone daily his symptoms worsened. Will keep him on 111mfor now. He is scheduled with Rheumatology soon.  Calcium and Vitamin D supplementation daily.   AnSarina IllMD  [facility] [address] [id number] [phone] total of 30 minutes was spent in with this patient face-to-face. Over half this time was spent on counseling patient on the autoimmune ilymphocytic nfiltrative skin, gland and muscle disease  and different therapeutic options available.

## 2015-02-14 ENCOUNTER — Other Ambulatory Visit: Payer: Self-pay | Admitting: Neurology

## 2015-02-20 ENCOUNTER — Telehealth: Payer: Self-pay | Admitting: Neurology

## 2015-02-20 ENCOUNTER — Encounter: Payer: Self-pay | Admitting: *Deleted

## 2015-02-20 NOTE — Telephone Encounter (Signed)
I spoke with the patient who verified he is taking 10mg  of prednisone daily currently

## 2015-02-20 NOTE — Telephone Encounter (Signed)
Left detailed VM for pt to call back regarding who the next rheumatologist is that he is going to see and when so we can fax partoid biopsy results/office note to them. Told him I am also mailing him a copy of the results. Gave [facility] phone number and office hours for him to call back.

## 2015-02-20 NOTE — Telephone Encounter (Signed)
Patrick Kane -   When we get the parotid biopsy results, would you mail a copy to patient and also ask patient which Rheumatologist he is seeing next and what date. I would like my last office note and the parotid biopsy results faxed to the rheumatologist he will be seeing please. Patrick Kane should also bring the results as well with him.   Let me know who he is seeing as I would like to call the rheumatologist to discuss the case. Thanks!

## 2015-02-25 NOTE — Telephone Encounter (Signed)
Pt wife Rod Holler) calling back and she stated she called and left message with [facility] to call back about methotrexate that Dr. Inda Merlin was going to prescribe at their last OV. She was not sure if he was going to start him on 4mg  or 5mg . She spoke w/ someone and left the message at their office.   Pt realized they still had referral from Dr. Charlestine Night (Rhuematologist) and she called over to their office and set up NP appt for 03/13/15 at 10:30am. She was told they will call back if they have any cancellations to be fit in sooner. They would like all notes from 12/19/14 to present faxed over. I told her I would do this. She verbalized understanding.

## 2015-02-25 NOTE — Telephone Encounter (Signed)
Left VM for pt to call back. Gave [facility] phone number and advised him that I would also try his home number.

## 2015-02-25 NOTE — Telephone Encounter (Signed)
Spoke w/ pt wife and she stated pt has been to see multiple providers at [facility]. Was unable to make appt with Dr. Raphael Gibney because Dr. Amil Amen and Dr. Raphael Gibney left practice to open their own practice. He saw a Dr. Chelsea Primus and then a Dr. Barb Merino. She stated Dr. Inda Merlin was going to have him try methotrexate and reduce prednisone to 7.5mg . She stated no order has been placed and they have been unable to reach office. I advised she call their office to clarify.  They checked hepatitis labs-negative, Ig4 series.  She is wondering if Dr. Jaynee Eagles can refer him to a different Rheumatologist. I told her I would speak w/ Dr. Jaynee Eagles and call her back. She verbalized understanding.   They went to appt this past Thursday and received the paperwork in the mail that I sent. They brought this to their appt.

## 2015-03-03 NOTE — Telephone Encounter (Signed)
Spoke to patient and his wife. The note from rheumatology suspected maybe the high pressures of cpap was causing the infiltrative skin changes but Dr. Charlestine Night did not rule out autoimmune disease. Patient is tapering the steroids and is on 5mg  a day now and feels like his face is swelling again. They really liked the rheumatologist Dr. Charlestine Night and trust him. They are to follow up especially if symptoms worsen on the lower dose of steroids. Methotrexate was discussed. Also f/u MRi was discussed and Dr. Charlestine Night and the patient want to hold off on that for now. I advised them that a repeat MRI w/wo contrast is warranted but will wait until patient would like to have it done. They have a follow up with me scheduled.

## 2015-04-09 ENCOUNTER — Telehealth: Payer: Self-pay | Admitting: Neurology

## 2015-04-09 DIAGNOSIS — L986 Other infiltrative disorders of the skin and subcutaneous tissue: Secondary | ICD-10-CM

## 2015-04-09 NOTE — Telephone Encounter (Signed)
Of course, Patrick Kane please place referral and ask [name] to fax my last office visit note. When that is complete, let patient know they can call Dr. Trudie Reed for appointment.Thank you

## 2015-04-09 NOTE — Telephone Encounter (Signed)
Referral placed to Dr. Trudie Reed at [facility].

## 2015-04-09 NOTE — Telephone Encounter (Signed)
Called wife back. She stated that the swelling has gotten worse since going off of prednisone on 03/30/15. He is not having double vision, but the swelling continues to be present. He is using the face ice pack, which helps the swelling go down. They went and saw Dr. Charlestine Night yesterday and he just did lab work and wanted to wait per wife. He did not want to do anything else. She wants a second opinion and a new referral sent to Dr. Trudie Reed at [facility] if possible. She spoke with Dr. Trudie Reed who said she would be happy to see him, he just needed a referral. I advised that Dr. Jaynee Eagles is not in the office today, but I will give her the message and call her back once I speak with her. She verbalized understanding.

## 2015-04-09 NOTE — Telephone Encounter (Signed)
Patient's wife is calling and states that her husband was referred to Dr. Charlestine Night and she prefers for him to see Dr. Lenna Gilford [phone].  Patient's eye is swelling again since the prednisone has been stopped.  Dr. Charlestine Night stopped the prednisone Sept 10th. Please call.

## 2015-04-10 NOTE — Telephone Encounter (Signed)
Called and spoke with patient to let him know Dr. Jaynee Eagles just placed referral to Dr. Trudie Reed yesterday and it may take a couple days for our referral coordinator to send referral over. Apologized for any misunderstanding. He verbalized understanding.

## 2015-04-10 NOTE — Telephone Encounter (Signed)
Wife called back stating she called [facility] and they told wife that they haven't received a referral from [place]. Wife left fax# [phone].

## 2015-04-10 NOTE — Telephone Encounter (Signed)
Wife called to check status of referral to [facility]. Advised referral was placed to Dr. Trudie Reed at [facility] 04/09/15 pm.

## 2015-04-15 NOTE — Telephone Encounter (Signed)
Sent referral over to Dr. Trudie Reed [phone].

## 2015-04-18 ENCOUNTER — Ambulatory Visit: Payer: Medicare Other | Admitting: Neurology

## 2015-04-25 ENCOUNTER — Other Ambulatory Visit: Payer: Self-pay | Admitting: Rheumatology

## 2015-04-25 ENCOUNTER — Other Ambulatory Visit: Payer: Self-pay | Admitting: Physician Assistant

## 2015-04-25 DIAGNOSIS — D803 Selective deficiency of immunoglobulin G [IgG] subclasses: Secondary | ICD-10-CM

## 2015-05-01 ENCOUNTER — Other Ambulatory Visit: Payer: Medicare Other

## 2016-01-01 ENCOUNTER — Other Ambulatory Visit: Payer: Self-pay | Admitting: Physician Assistant

## 2016-01-01 DIAGNOSIS — M79605 Pain in left leg: Secondary | ICD-10-CM

## 2016-01-05 ENCOUNTER — Ambulatory Visit
Admission: RE | Admit: 2016-01-05 | Discharge: 2016-01-05 | Disposition: A | Discharge status: Home / Self Care | Payer: Medicare Other | Source: Ambulatory Visit | Attending: Physician Assistant | Admitting: Physician Assistant

## 2016-01-05 DIAGNOSIS — M79605 Pain in left leg: Secondary | ICD-10-CM

## 2016-05-13 IMAGING — MR MR ORBITS W/O CM
14 of 17 series · 26 of 48 positions shown · non-contrast
Comparison: None.

ADDENDUM:
Study discussed by telephone with Dr. Kurt Angelo (covering for Dr. JOSHJAX
GARGIULO ) on 11/30/2014 at 1137 hours.

We discussed that I favor an underlying chronic systemic
inflammatory/autoimmune disease in this patient such as
Sjogren syndrome, sarcoidosis, thyroiditis, etc.
But also that I am highly suspicious of progression to an
infiltrating lymphoma given the mass-like appearance of the lacrimal
glands and lateral rectus muscles.
Skin punch biopsy and/or left lacrimal gland biopsy might prove most
valuable.
CLINICAL DATA: 59-year-old male with diplopia and left eye
proptosis. Headache for 5 days. No pain on the eye movements. Left
sixth nerve palsy. Initial encounter.
TECHNIQUE: Multiplanar, multiecho pulse sequences of the brain, orbits, and
surrounding structures were obtained without intravenous contrast.
Angiographic images of the head were obtained using MRA technique
without contrast.

[Series 2: FLAIR · sagittal · 5.0mm · 0.47mm/px · 1 of 25 slices shown (1 of 2)]
[im 1/25]
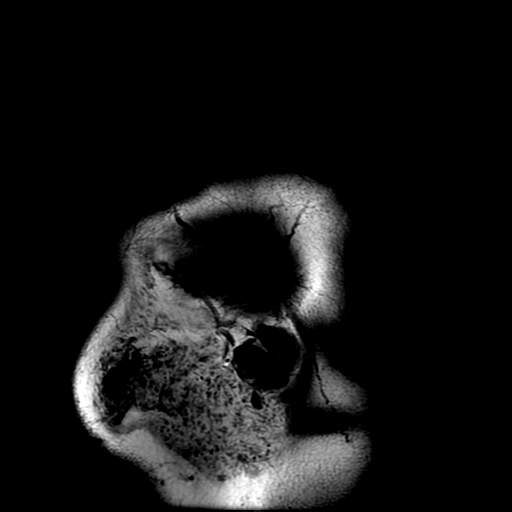

[Series 3: T2 · coronal · 5.0mm · 0.47mm/px · 2 of 31 slices shown (1 of 2)]
[im 1/31]
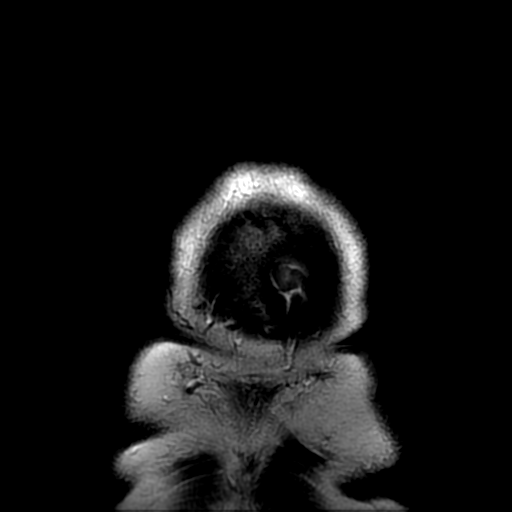
[im 31/31]
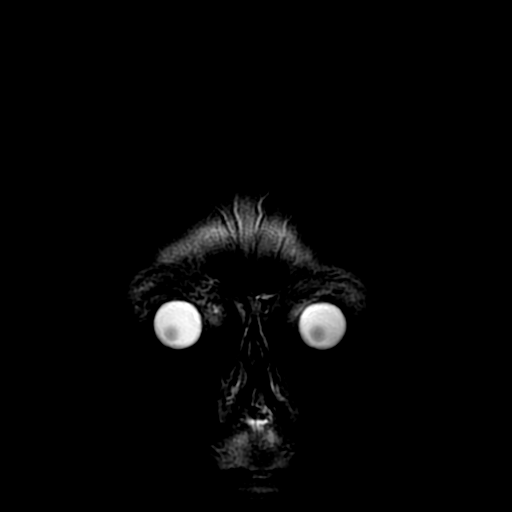

[Series 4: T2 · axial · 5.0mm · 0.47mm/px · 1 of 26 slices shown (2 of 2)]
[im 1/26]
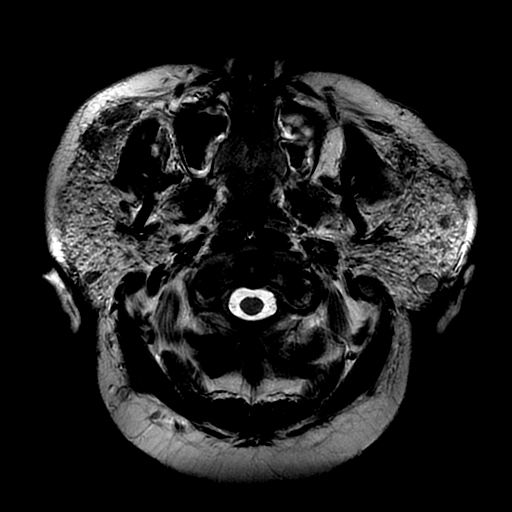

[Series 5: FLAIR · axial · 5.0mm · 0.47mm/px · 1 of 26 slices shown (2 of 2)]
[im 1/26]
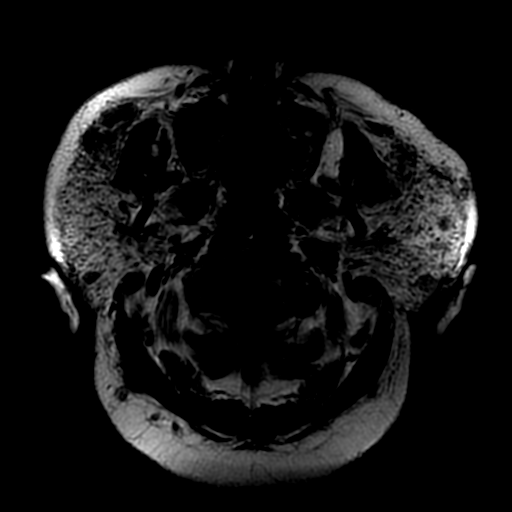

[Series 7: DWI · axial · 3.0mm · 0.94mm/px · z∈[-62,+89]mm · 5 of 104 slices shown (1 of 4)]
[im 1/104]
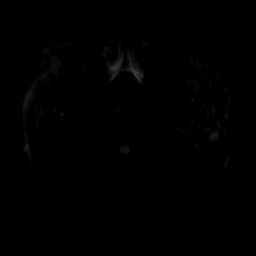
[im 26/104]
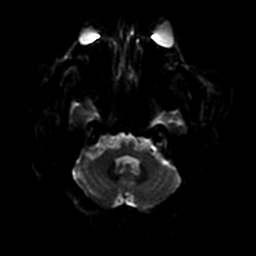
[im 52/104]
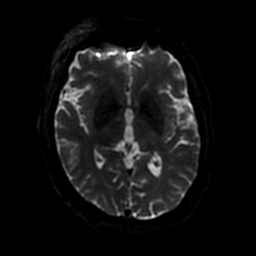
[im 78/104]
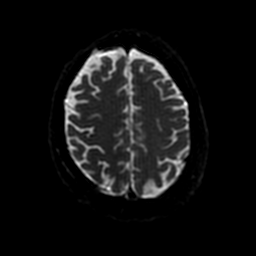
[im 104/104]
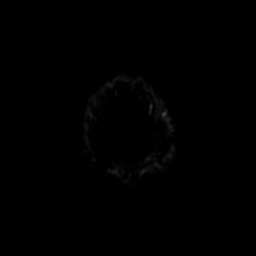

[Series 8: DWI · coronal · 5.0mm · 0.94mm/px · 4 of 72 slices shown (2 of 4)]
[im 1/72]
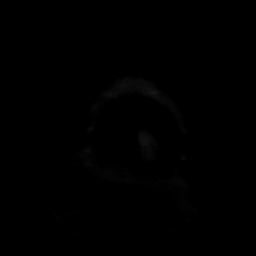
[im 24/72]
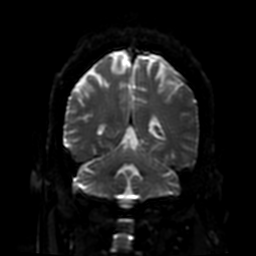
[im 48/72]
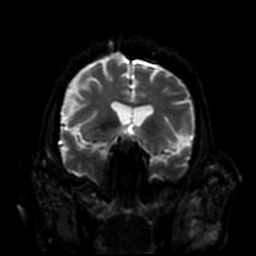
[im 72/72]
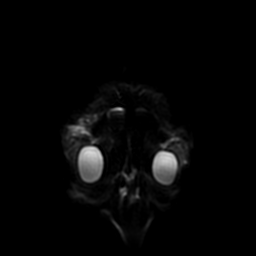

[Series 9: ax (id) 2 · axial · 1.2mm · 0.43mm/px · z∈[-90,-77]mm · 2 of 200 slices shown]
[im 1/200]
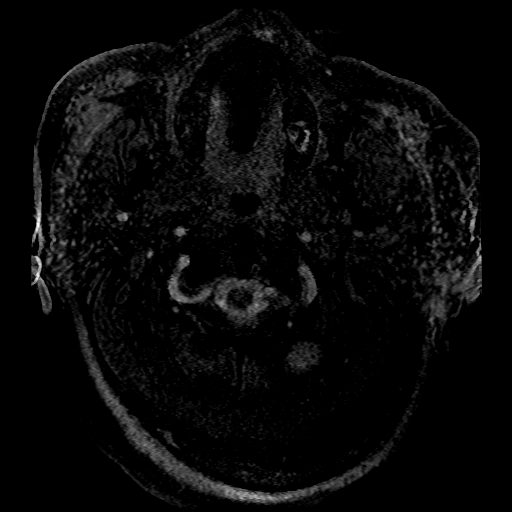
[im 23/200]
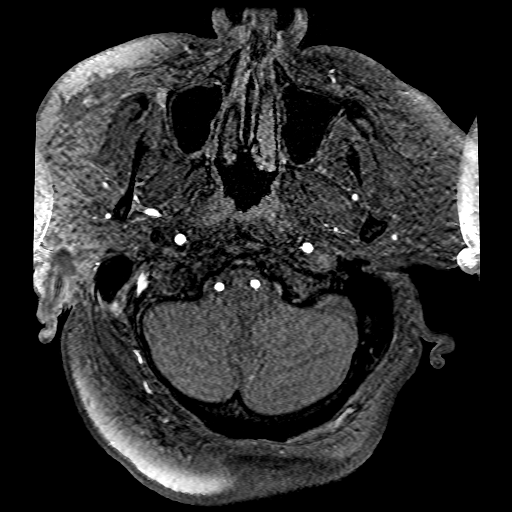

[Series 12: STIR · coronal · 4.0mm · 0.39mm/px · 1 of 24 slices shown]
[im 1/24]
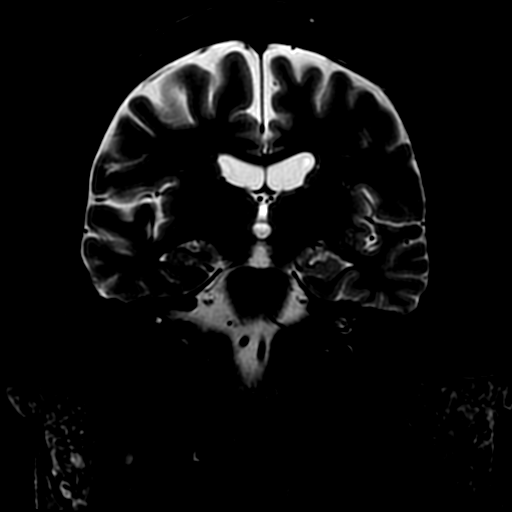

[Series 13: T2 fat-sat · coronal · 4.0mm · 0.39mm/px · 1 of 24 slices shown (1 of 2)]
[im 1/24]
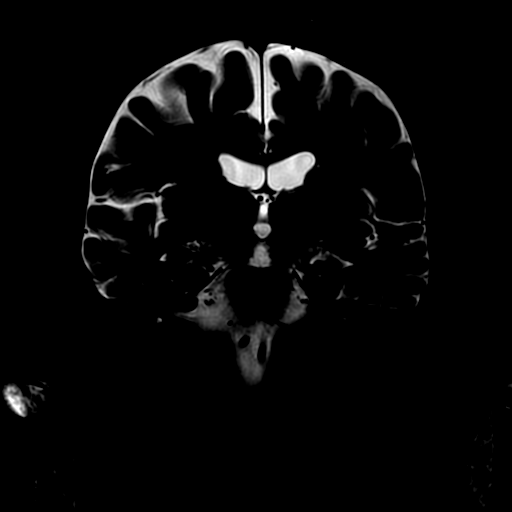

[Series 14: T1 · coronal · 4.0mm · 0.39mm/px · 1 of 24 slices shown (1 of 2)]
[im 1/24]
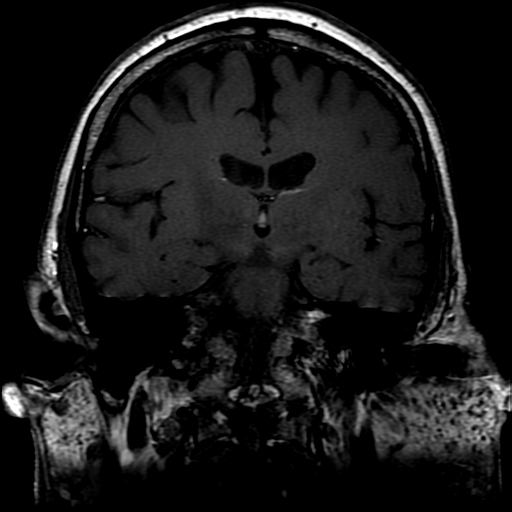

[Series 15: T2 fat-sat · axial · 3.0mm · 0.39mm/px · 1 of 18 slices shown (2 of 2)]
[im 1/18]
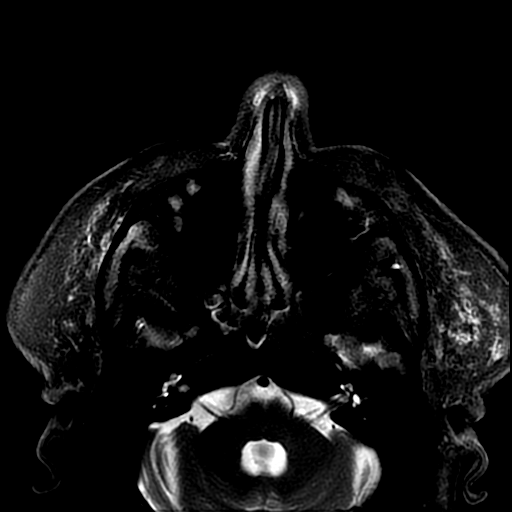

[Series 16: T1 · axial · 3.0mm · 0.39mm/px · 1 of 18 slices shown (2 of 2)]
[im 1/18]
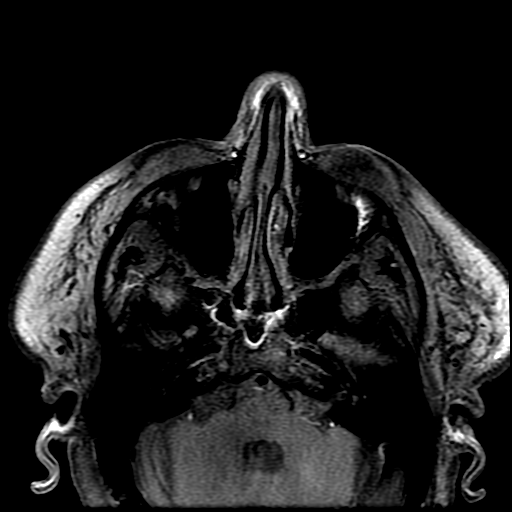

[Series 700: DWI · axial · 3.0mm · 0.94mm/px · z∈[-62,+89]mm · 3 of 51 slices shown (3 of 4)]
[im 1/51]
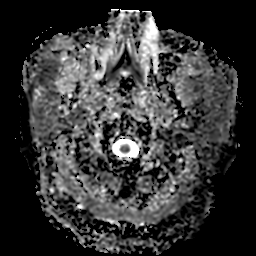
[im 26/51]
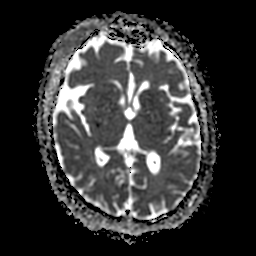
[im 51/51]
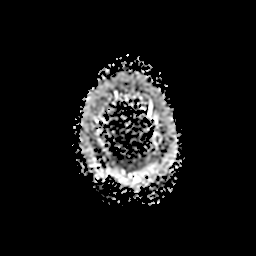

[Series 800: DWI · coronal · 5.0mm · 0.94mm/px · 2 of 36 slices shown (4 of 4)]
[im 1/36]
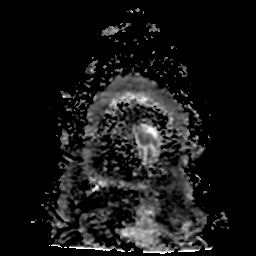
[im 36/36]
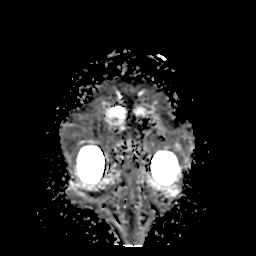

[26 of 48 positions shown; findings below may reference images not displayed]

A study without and with IV contrast was planned, but despite
attempts no intravenous access could be obtained, an the patient
refused further IV placement attempts. No contrast administered.

CONTRAST:  None.

EXAM:
MRI HEAD WITHOUT CONTRAST

MRA HEAD WITHOUT CONTRAST

MRI OF THE ORBITS WITHOUT CONTRAST
FINDINGS: MRI HEAD FINDINGS

Major intracranial vascular flow voids are within normal limits. No
restricted diffusion to suggest acute infarction. No midline shift,
mass effect, evidence of mass lesion, ventriculomegaly, extra-axial
collection or acute intracranial hemorrhage. Cervicomedullary
junction and pituitary are within normal limits. Negative visualized
cervical spine. Gray and white matter signal is within normal limits
for age throughout the brain. No cortical encephalomalacia.

Visible internal auditory structures appear normal. Mastoids are
clear. Bone marrow signal is normal.

Diffusely abnormal appearance of the periorbital and face
superficial soft tissues. This includes the bilateral parotid glands
which have a nodular fatty infiltrated appearance. Elsewhere the
affected subcutaneous fat appears infiltrated/indurated. The changes
are fairly symmetric. The scalp soft tissues at the vertex, an the
suboccipital soft tissues are spared. There is no associated upper
cervical lymphadenopathy. However, the affected soft tissues do
appear somewhat restricted on diffusion. See associated intraorbital
abnormality below.

MRA HEAD FINDINGS

Codominant distal vertebral arteries with antegrade flow in the
posterior circulation. No distal vertebral artery or basilar
stenosis. Normal right PICA and AICA origins. Normal SCA and PCA
origins. Posterior communicating arteries are diminutive or absent.
Bilateral PCA branches are within normal limits.

Antegrade flow in both ICA siphons with no stenosis. Ophthalmic
artery origins are normal. Normal carotid termini. Normal MCA and
ACA origins. Anterior communicating artery and visualized bilateral
ACA branches are within normal limits. Visualized bilateral MCA
branches are within normal limits; early right MCA branching, normal
anatomic variation.

MRI ORBITS FINDINGS

Bilateral exophthalmos. Both globes appear intact. Diffuse abnormal
superficial periorbital and face soft tissue appearance, fairly
symmetric.

Along the lateral aspect of both orbits the abnormal signal in the
subcutaneous fat, with infiltrated appearing decreased T1 and
increased T2 signal, tracks into the extraconal space. This is in
the region of the lacrimal glands and is more masslike on the left
(series 14, image 16). However, the intraorbital fat demonstrates
little to no associated stranding or T2 hyperintensity.

There is superimposed moderate to severe enlargement of the
bilateral lateral rectus muscles worse on the left (series 15, image
7 and series 14, image 14). Both muscles appear mildly T2
hyperintense.

No other extraocular muscle abnormality.

Normal optic chiasm. Normal non contrast appearance of the cavernous
sinus. No suprasellar mass or mass effect. Both optic nerves appear
diminutive but otherwise normal. No contrast administered.

Mild to moderate paranasal sinus mucosal thickening.
IMPRESSION: 1. Contrast could not be administered due to inability to obtain IV
access.
2. Dominant finding is diffusely abnormal superficial face and
periorbital soft tissue appearance. The bilateral parotid and
lacrimal glands are affected and are diffusely abnormal.
3. Bilateral exophthalmos and left worse than right lateral rectus
muscle enlargement.
4. Negative non contrast MRI and MRA appearance of the brain.
5. The imaging appearance of #2 and #3 is nonspecific. Favor a
systemic inflammatory process as the unifying diagnosis in this
case. Dermatology consultation may be valuable. A combination of
Thyroid Orbitopathy and Thyroid Acropachy is considered (such as due
to Graves disease), but the pattern of extraocular muscle
involvement seen here would be very atypical. An unusual infection
or unusual presentation of malignancy such as lymphoma also should
be considered.

## 2019-07-24 DIAGNOSIS — I10 Essential (primary) hypertension: Secondary | ICD-10-CM | POA: Diagnosis not present

## 2019-07-24 DIAGNOSIS — E1165 Type 2 diabetes mellitus with hyperglycemia: Secondary | ICD-10-CM | POA: Diagnosis not present

## 2019-07-24 DIAGNOSIS — N189 Chronic kidney disease, unspecified: Secondary | ICD-10-CM | POA: Diagnosis not present

## 2019-07-24 DIAGNOSIS — E02 Subclinical iodine-deficiency hypothyroidism: Secondary | ICD-10-CM | POA: Diagnosis not present

## 2019-08-10 DIAGNOSIS — E119 Type 2 diabetes mellitus without complications: Secondary | ICD-10-CM | POA: Diagnosis not present

## 2019-08-10 DIAGNOSIS — Z794 Long term (current) use of insulin: Secondary | ICD-10-CM | POA: Diagnosis not present

## 2019-08-21 DIAGNOSIS — M170 Bilateral primary osteoarthritis of knee: Secondary | ICD-10-CM | POA: Diagnosis not present

## 2019-09-04 DIAGNOSIS — M170 Bilateral primary osteoarthritis of knee: Secondary | ICD-10-CM | POA: Diagnosis not present

## 2019-09-10 DIAGNOSIS — E119 Type 2 diabetes mellitus without complications: Secondary | ICD-10-CM | POA: Diagnosis not present

## 2019-09-10 DIAGNOSIS — Z794 Long term (current) use of insulin: Secondary | ICD-10-CM | POA: Diagnosis not present

## 2019-09-11 DIAGNOSIS — M170 Bilateral primary osteoarthritis of knee: Secondary | ICD-10-CM | POA: Diagnosis not present

## 2019-09-18 DIAGNOSIS — M170 Bilateral primary osteoarthritis of knee: Secondary | ICD-10-CM | POA: Diagnosis not present

## 2019-09-26 DIAGNOSIS — Z09 Encounter for follow-up examination after completed treatment for conditions other than malignant neoplasm: Secondary | ICD-10-CM | POA: Diagnosis not present

## 2019-09-26 DIAGNOSIS — D8989 Other specified disorders involving the immune mechanism, not elsewhere classified: Secondary | ICD-10-CM | POA: Diagnosis not present

## 2019-09-26 DIAGNOSIS — R599 Enlarged lymph nodes, unspecified: Secondary | ICD-10-CM | POA: Diagnosis not present

## 2019-09-28 DIAGNOSIS — D8989 Other specified disorders involving the immune mechanism, not elsewhere classified: Secondary | ICD-10-CM | POA: Diagnosis not present

## 2019-10-08 DIAGNOSIS — Z794 Long term (current) use of insulin: Secondary | ICD-10-CM | POA: Diagnosis not present

## 2019-10-08 DIAGNOSIS — E119 Type 2 diabetes mellitus without complications: Secondary | ICD-10-CM | POA: Diagnosis not present

## 2019-10-09 DIAGNOSIS — H2513 Age-related nuclear cataract, bilateral: Secondary | ICD-10-CM | POA: Diagnosis not present

## 2019-10-09 DIAGNOSIS — H401131 Primary open-angle glaucoma, bilateral, mild stage: Secondary | ICD-10-CM | POA: Diagnosis not present

## 2019-10-13 DIAGNOSIS — G4733 Obstructive sleep apnea (adult) (pediatric): Secondary | ICD-10-CM | POA: Diagnosis not present

## 2019-10-17 DIAGNOSIS — N183 Chronic kidney disease, stage 3 unspecified: Secondary | ICD-10-CM | POA: Diagnosis not present

## 2019-10-17 DIAGNOSIS — Z7189 Other specified counseling: Secondary | ICD-10-CM | POA: Diagnosis not present

## 2019-10-17 DIAGNOSIS — Z79899 Other long term (current) drug therapy: Secondary | ICD-10-CM | POA: Diagnosis not present

## 2019-10-17 DIAGNOSIS — Z0000 Encounter for general adult medical examination without abnormal findings: Secondary | ICD-10-CM | POA: Diagnosis not present

## 2019-10-17 DIAGNOSIS — E039 Hypothyroidism, unspecified: Secondary | ICD-10-CM | POA: Diagnosis not present

## 2019-10-17 DIAGNOSIS — R5383 Other fatigue: Secondary | ICD-10-CM | POA: Diagnosis not present

## 2019-10-17 DIAGNOSIS — E1122 Type 2 diabetes mellitus with diabetic chronic kidney disease: Secondary | ICD-10-CM | POA: Diagnosis not present

## 2019-10-17 DIAGNOSIS — E1165 Type 2 diabetes mellitus with hyperglycemia: Secondary | ICD-10-CM | POA: Diagnosis not present

## 2019-10-17 DIAGNOSIS — Z1211 Encounter for screening for malignant neoplasm of colon: Secondary | ICD-10-CM | POA: Diagnosis not present

## 2019-10-17 DIAGNOSIS — Z299 Encounter for prophylactic measures, unspecified: Secondary | ICD-10-CM | POA: Diagnosis not present

## 2019-10-17 DIAGNOSIS — I10 Essential (primary) hypertension: Secondary | ICD-10-CM | POA: Diagnosis not present

## 2019-10-17 DIAGNOSIS — E7800 Pure hypercholesterolemia, unspecified: Secondary | ICD-10-CM | POA: Diagnosis not present

## 2019-11-06 DIAGNOSIS — E559 Vitamin D deficiency, unspecified: Secondary | ICD-10-CM | POA: Diagnosis not present

## 2019-11-06 DIAGNOSIS — M170 Bilateral primary osteoarthritis of knee: Secondary | ICD-10-CM | POA: Diagnosis not present

## 2019-11-17 DIAGNOSIS — I10 Essential (primary) hypertension: Secondary | ICD-10-CM | POA: Diagnosis not present

## 2019-11-17 DIAGNOSIS — J40 Bronchitis, not specified as acute or chronic: Secondary | ICD-10-CM | POA: Diagnosis not present

## 2019-12-01 DIAGNOSIS — Z794 Long term (current) use of insulin: Secondary | ICD-10-CM | POA: Diagnosis not present

## 2019-12-01 DIAGNOSIS — E119 Type 2 diabetes mellitus without complications: Secondary | ICD-10-CM | POA: Diagnosis not present

## 2019-12-18 DIAGNOSIS — I10 Essential (primary) hypertension: Secondary | ICD-10-CM | POA: Diagnosis not present

## 2019-12-18 DIAGNOSIS — E7800 Pure hypercholesterolemia, unspecified: Secondary | ICD-10-CM | POA: Diagnosis not present

## 2019-12-20 DIAGNOSIS — D803 Selective deficiency of immunoglobulin G [IgG] subclasses: Secondary | ICD-10-CM | POA: Diagnosis not present

## 2019-12-20 DIAGNOSIS — J069 Acute upper respiratory infection, unspecified: Secondary | ICD-10-CM | POA: Diagnosis not present

## 2019-12-20 DIAGNOSIS — I10 Essential (primary) hypertension: Secondary | ICD-10-CM | POA: Diagnosis not present

## 2019-12-20 DIAGNOSIS — N1832 Chronic kidney disease, stage 3b: Secondary | ICD-10-CM | POA: Diagnosis not present

## 2019-12-20 DIAGNOSIS — Z299 Encounter for prophylactic measures, unspecified: Secondary | ICD-10-CM | POA: Diagnosis not present

## 2019-12-27 DIAGNOSIS — I10 Essential (primary) hypertension: Secondary | ICD-10-CM | POA: Diagnosis not present

## 2019-12-27 DIAGNOSIS — J069 Acute upper respiratory infection, unspecified: Secondary | ICD-10-CM | POA: Diagnosis not present

## 2019-12-27 DIAGNOSIS — Z299 Encounter for prophylactic measures, unspecified: Secondary | ICD-10-CM | POA: Diagnosis not present

## 2020-01-17 DIAGNOSIS — I10 Essential (primary) hypertension: Secondary | ICD-10-CM | POA: Diagnosis not present

## 2020-01-17 DIAGNOSIS — D849 Immunodeficiency, unspecified: Secondary | ICD-10-CM | POA: Diagnosis not present

## 2020-01-17 DIAGNOSIS — L03116 Cellulitis of left lower limb: Secondary | ICD-10-CM | POA: Diagnosis not present

## 2020-01-17 DIAGNOSIS — N1832 Chronic kidney disease, stage 3b: Secondary | ICD-10-CM | POA: Diagnosis not present

## 2020-01-17 DIAGNOSIS — Z299 Encounter for prophylactic measures, unspecified: Secondary | ICD-10-CM | POA: Diagnosis not present

## 2020-01-23 DIAGNOSIS — E02 Subclinical iodine-deficiency hypothyroidism: Secondary | ICD-10-CM | POA: Diagnosis not present

## 2020-01-23 DIAGNOSIS — E7800 Pure hypercholesterolemia, unspecified: Secondary | ICD-10-CM | POA: Diagnosis not present

## 2020-01-23 DIAGNOSIS — E1165 Type 2 diabetes mellitus with hyperglycemia: Secondary | ICD-10-CM | POA: Diagnosis not present

## 2020-01-23 DIAGNOSIS — I10 Essential (primary) hypertension: Secondary | ICD-10-CM | POA: Diagnosis not present

## 2020-01-24 DIAGNOSIS — L0390 Cellulitis, unspecified: Secondary | ICD-10-CM | POA: Diagnosis not present

## 2020-01-24 DIAGNOSIS — E1165 Type 2 diabetes mellitus with hyperglycemia: Secondary | ICD-10-CM | POA: Diagnosis not present

## 2020-01-24 DIAGNOSIS — E1122 Type 2 diabetes mellitus with diabetic chronic kidney disease: Secondary | ICD-10-CM | POA: Diagnosis not present

## 2020-01-24 DIAGNOSIS — I10 Essential (primary) hypertension: Secondary | ICD-10-CM | POA: Diagnosis not present

## 2020-01-24 DIAGNOSIS — Z794 Long term (current) use of insulin: Secondary | ICD-10-CM | POA: Diagnosis not present

## 2020-01-24 DIAGNOSIS — Z299 Encounter for prophylactic measures, unspecified: Secondary | ICD-10-CM | POA: Diagnosis not present

## 2020-01-25 DIAGNOSIS — E1165 Type 2 diabetes mellitus with hyperglycemia: Secondary | ICD-10-CM | POA: Diagnosis not present

## 2020-01-31 DIAGNOSIS — E1165 Type 2 diabetes mellitus with hyperglycemia: Secondary | ICD-10-CM | POA: Diagnosis not present

## 2020-01-31 DIAGNOSIS — Z794 Long term (current) use of insulin: Secondary | ICD-10-CM | POA: Diagnosis not present

## 2020-01-31 DIAGNOSIS — I10 Essential (primary) hypertension: Secondary | ICD-10-CM | POA: Diagnosis not present

## 2020-01-31 DIAGNOSIS — L0390 Cellulitis, unspecified: Secondary | ICD-10-CM | POA: Diagnosis not present

## 2020-01-31 DIAGNOSIS — E1122 Type 2 diabetes mellitus with diabetic chronic kidney disease: Secondary | ICD-10-CM | POA: Diagnosis not present

## 2020-01-31 DIAGNOSIS — Z299 Encounter for prophylactic measures, unspecified: Secondary | ICD-10-CM | POA: Diagnosis not present

## 2020-02-06 DIAGNOSIS — E1159 Type 2 diabetes mellitus with other circulatory complications: Secondary | ICD-10-CM | POA: Diagnosis not present

## 2020-02-06 DIAGNOSIS — E1143 Type 2 diabetes mellitus with diabetic autonomic (poly)neuropathy: Secondary | ICD-10-CM | POA: Diagnosis not present

## 2020-02-14 DIAGNOSIS — Z09 Encounter for follow-up examination after completed treatment for conditions other than malignant neoplasm: Secondary | ICD-10-CM | POA: Diagnosis not present

## 2020-02-14 DIAGNOSIS — D8989 Other specified disorders involving the immune mechanism, not elsewhere classified: Secondary | ICD-10-CM | POA: Diagnosis not present

## 2020-02-14 DIAGNOSIS — R599 Enlarged lymph nodes, unspecified: Secondary | ICD-10-CM | POA: Diagnosis not present

## 2020-02-14 DIAGNOSIS — H0520 Unspecified exophthalmos: Secondary | ICD-10-CM | POA: Diagnosis not present

## 2020-02-14 DIAGNOSIS — E559 Vitamin D deficiency, unspecified: Secondary | ICD-10-CM | POA: Diagnosis not present

## 2020-02-15 DIAGNOSIS — G4733 Obstructive sleep apnea (adult) (pediatric): Secondary | ICD-10-CM | POA: Diagnosis not present

## 2020-02-15 DIAGNOSIS — D8989 Other specified disorders involving the immune mechanism, not elsewhere classified: Secondary | ICD-10-CM | POA: Diagnosis not present

## 2020-02-16 DIAGNOSIS — E1122 Type 2 diabetes mellitus with diabetic chronic kidney disease: Secondary | ICD-10-CM | POA: Diagnosis not present

## 2020-02-16 DIAGNOSIS — I129 Hypertensive chronic kidney disease with stage 1 through stage 4 chronic kidney disease, or unspecified chronic kidney disease: Secondary | ICD-10-CM | POA: Diagnosis not present

## 2020-02-16 DIAGNOSIS — E785 Hyperlipidemia, unspecified: Secondary | ICD-10-CM | POA: Diagnosis not present

## 2020-02-16 DIAGNOSIS — N183 Chronic kidney disease, stage 3 unspecified: Secondary | ICD-10-CM | POA: Diagnosis not present

## 2020-02-19 DIAGNOSIS — M170 Bilateral primary osteoarthritis of knee: Secondary | ICD-10-CM | POA: Diagnosis not present

## 2020-02-22 DIAGNOSIS — E1122 Type 2 diabetes mellitus with diabetic chronic kidney disease: Secondary | ICD-10-CM | POA: Diagnosis not present

## 2020-02-22 DIAGNOSIS — N183 Chronic kidney disease, stage 3 unspecified: Secondary | ICD-10-CM | POA: Diagnosis not present

## 2020-02-22 DIAGNOSIS — Z794 Long term (current) use of insulin: Secondary | ICD-10-CM | POA: Diagnosis not present

## 2020-02-22 DIAGNOSIS — E1165 Type 2 diabetes mellitus with hyperglycemia: Secondary | ICD-10-CM | POA: Diagnosis not present

## 2020-02-22 DIAGNOSIS — I10 Essential (primary) hypertension: Secondary | ICD-10-CM | POA: Diagnosis not present

## 2020-02-22 DIAGNOSIS — Z299 Encounter for prophylactic measures, unspecified: Secondary | ICD-10-CM | POA: Diagnosis not present

## 2020-02-27 DIAGNOSIS — Z794 Long term (current) use of insulin: Secondary | ICD-10-CM | POA: Diagnosis not present

## 2020-02-27 DIAGNOSIS — E119 Type 2 diabetes mellitus without complications: Secondary | ICD-10-CM | POA: Diagnosis not present

## 2020-04-10 DIAGNOSIS — Z9889 Other specified postprocedural states: Secondary | ICD-10-CM | POA: Diagnosis not present

## 2020-04-10 DIAGNOSIS — H05243 Constant exophthalmos, bilateral: Secondary | ICD-10-CM | POA: Diagnosis not present

## 2020-04-10 DIAGNOSIS — H2513 Age-related nuclear cataract, bilateral: Secondary | ICD-10-CM | POA: Diagnosis not present

## 2020-04-10 DIAGNOSIS — H401131 Primary open-angle glaucoma, bilateral, mild stage: Secondary | ICD-10-CM | POA: Diagnosis not present

## 2020-04-10 DIAGNOSIS — E119 Type 2 diabetes mellitus without complications: Secondary | ICD-10-CM | POA: Diagnosis not present

## 2020-05-17 DIAGNOSIS — E7849 Other hyperlipidemia: Secondary | ICD-10-CM | POA: Diagnosis not present

## 2020-05-17 DIAGNOSIS — I10 Essential (primary) hypertension: Secondary | ICD-10-CM | POA: Diagnosis not present

## 2020-05-21 DIAGNOSIS — Z299 Encounter for prophylactic measures, unspecified: Secondary | ICD-10-CM | POA: Diagnosis not present

## 2020-05-21 DIAGNOSIS — Z23 Encounter for immunization: Secondary | ICD-10-CM | POA: Diagnosis not present

## 2020-05-27 DIAGNOSIS — G4733 Obstructive sleep apnea (adult) (pediatric): Secondary | ICD-10-CM | POA: Diagnosis not present

## 2020-05-29 DIAGNOSIS — E119 Type 2 diabetes mellitus without complications: Secondary | ICD-10-CM | POA: Diagnosis not present

## 2020-05-29 DIAGNOSIS — Z794 Long term (current) use of insulin: Secondary | ICD-10-CM | POA: Diagnosis not present

## 2020-07-19 DIAGNOSIS — E7849 Other hyperlipidemia: Secondary | ICD-10-CM | POA: Diagnosis not present

## 2020-07-19 DIAGNOSIS — I10 Essential (primary) hypertension: Secondary | ICD-10-CM | POA: Diagnosis not present

## 2020-07-25 DIAGNOSIS — E02 Subclinical iodine-deficiency hypothyroidism: Secondary | ICD-10-CM | POA: Diagnosis not present

## 2020-07-25 DIAGNOSIS — I10 Essential (primary) hypertension: Secondary | ICD-10-CM | POA: Diagnosis not present

## 2020-07-25 DIAGNOSIS — E1165 Type 2 diabetes mellitus with hyperglycemia: Secondary | ICD-10-CM | POA: Diagnosis not present

## 2020-07-25 DIAGNOSIS — E559 Vitamin D deficiency, unspecified: Secondary | ICD-10-CM | POA: Diagnosis not present

## 2020-08-12 DIAGNOSIS — I670 Dissection of cerebral arteries, nonruptured: Secondary | ICD-10-CM | POA: Diagnosis not present

## 2020-08-12 DIAGNOSIS — N182 Chronic kidney disease, stage 2 (mild): Secondary | ICD-10-CM | POA: Diagnosis not present

## 2020-08-12 DIAGNOSIS — Z9229 Personal history of other drug therapy: Secondary | ICD-10-CM | POA: Diagnosis not present

## 2020-08-12 DIAGNOSIS — D8989 Other specified disorders involving the immune mechanism, not elsewhere classified: Secondary | ICD-10-CM | POA: Diagnosis not present

## 2020-08-12 DIAGNOSIS — H0520 Unspecified exophthalmos: Secondary | ICD-10-CM | POA: Diagnosis not present

## 2020-08-12 DIAGNOSIS — Z09 Encounter for follow-up examination after completed treatment for conditions other than malignant neoplasm: Secondary | ICD-10-CM | POA: Diagnosis not present

## 2020-08-12 DIAGNOSIS — E559 Vitamin D deficiency, unspecified: Secondary | ICD-10-CM | POA: Diagnosis not present

## 2020-08-15 DIAGNOSIS — D8989 Other specified disorders involving the immune mechanism, not elsewhere classified: Secondary | ICD-10-CM | POA: Diagnosis not present

## 2020-08-19 DIAGNOSIS — I10 Essential (primary) hypertension: Secondary | ICD-10-CM | POA: Diagnosis not present

## 2020-08-19 DIAGNOSIS — E7849 Other hyperlipidemia: Secondary | ICD-10-CM | POA: Diagnosis not present

## 2020-08-27 DIAGNOSIS — E119 Type 2 diabetes mellitus without complications: Secondary | ICD-10-CM | POA: Diagnosis not present

## 2020-08-27 DIAGNOSIS — Z794 Long term (current) use of insulin: Secondary | ICD-10-CM | POA: Diagnosis not present

## 2020-09-16 DIAGNOSIS — E7849 Other hyperlipidemia: Secondary | ICD-10-CM | POA: Diagnosis not present

## 2020-09-16 DIAGNOSIS — I10 Essential (primary) hypertension: Secondary | ICD-10-CM | POA: Diagnosis not present

## 2020-10-09 DIAGNOSIS — H401131 Primary open-angle glaucoma, bilateral, mild stage: Secondary | ICD-10-CM | POA: Diagnosis not present

## 2020-10-09 DIAGNOSIS — H2513 Age-related nuclear cataract, bilateral: Secondary | ICD-10-CM | POA: Diagnosis not present

## 2020-11-14 DIAGNOSIS — M109 Gout, unspecified: Secondary | ICD-10-CM | POA: Diagnosis not present

## 2020-11-14 DIAGNOSIS — Z299 Encounter for prophylactic measures, unspecified: Secondary | ICD-10-CM | POA: Diagnosis not present

## 2020-11-14 DIAGNOSIS — E1165 Type 2 diabetes mellitus with hyperglycemia: Secondary | ICD-10-CM | POA: Diagnosis not present

## 2020-11-16 DIAGNOSIS — E7849 Other hyperlipidemia: Secondary | ICD-10-CM | POA: Diagnosis not present

## 2020-11-16 DIAGNOSIS — I10 Essential (primary) hypertension: Secondary | ICD-10-CM | POA: Diagnosis not present

## 2020-11-27 DIAGNOSIS — E119 Type 2 diabetes mellitus without complications: Secondary | ICD-10-CM | POA: Diagnosis not present

## 2020-11-27 DIAGNOSIS — Z7189 Other specified counseling: Secondary | ICD-10-CM | POA: Diagnosis not present

## 2020-11-27 DIAGNOSIS — E7800 Pure hypercholesterolemia, unspecified: Secondary | ICD-10-CM | POA: Diagnosis not present

## 2020-11-27 DIAGNOSIS — Z0000 Encounter for general adult medical examination without abnormal findings: Secondary | ICD-10-CM | POA: Diagnosis not present

## 2020-11-27 DIAGNOSIS — E038 Other specified hypothyroidism: Secondary | ICD-10-CM | POA: Diagnosis not present

## 2020-11-27 DIAGNOSIS — Z299 Encounter for prophylactic measures, unspecified: Secondary | ICD-10-CM | POA: Diagnosis not present

## 2020-11-27 DIAGNOSIS — Z79899 Other long term (current) drug therapy: Secondary | ICD-10-CM | POA: Diagnosis not present

## 2020-11-27 DIAGNOSIS — Z794 Long term (current) use of insulin: Secondary | ICD-10-CM | POA: Diagnosis not present

## 2020-11-27 DIAGNOSIS — R5383 Other fatigue: Secondary | ICD-10-CM | POA: Diagnosis not present

## 2020-11-27 DIAGNOSIS — I10 Essential (primary) hypertension: Secondary | ICD-10-CM | POA: Diagnosis not present

## 2020-11-28 DIAGNOSIS — G4733 Obstructive sleep apnea (adult) (pediatric): Secondary | ICD-10-CM | POA: Diagnosis not present

## 2020-12-16 DIAGNOSIS — I10 Essential (primary) hypertension: Secondary | ICD-10-CM | POA: Diagnosis not present

## 2020-12-16 DIAGNOSIS — E7849 Other hyperlipidemia: Secondary | ICD-10-CM | POA: Diagnosis not present

## 2020-12-30 DIAGNOSIS — M170 Bilateral primary osteoarthritis of knee: Secondary | ICD-10-CM | POA: Diagnosis not present

## 2021-01-16 DIAGNOSIS — I10 Essential (primary) hypertension: Secondary | ICD-10-CM | POA: Diagnosis not present

## 2021-01-16 DIAGNOSIS — E7849 Other hyperlipidemia: Secondary | ICD-10-CM | POA: Diagnosis not present

## 2021-01-23 DIAGNOSIS — E1165 Type 2 diabetes mellitus with hyperglycemia: Secondary | ICD-10-CM | POA: Diagnosis not present

## 2021-01-23 DIAGNOSIS — I10 Essential (primary) hypertension: Secondary | ICD-10-CM | POA: Diagnosis not present

## 2021-01-23 DIAGNOSIS — E7800 Pure hypercholesterolemia, unspecified: Secondary | ICD-10-CM | POA: Diagnosis not present

## 2021-01-23 DIAGNOSIS — H0589 Other disorders of orbit: Secondary | ICD-10-CM | POA: Diagnosis not present

## 2021-01-23 DIAGNOSIS — E02 Subclinical iodine-deficiency hypothyroidism: Secondary | ICD-10-CM | POA: Diagnosis not present

## 2021-01-23 DIAGNOSIS — R768 Other specified abnormal immunological findings in serum: Secondary | ICD-10-CM | POA: Diagnosis not present

## 2021-01-23 DIAGNOSIS — K111 Hypertrophy of salivary gland: Secondary | ICD-10-CM | POA: Diagnosis not present

## 2021-01-23 DIAGNOSIS — N189 Chronic kidney disease, unspecified: Secondary | ICD-10-CM | POA: Diagnosis not present

## 2021-01-23 DIAGNOSIS — R9389 Abnormal findings on diagnostic imaging of other specified body structures: Secondary | ICD-10-CM | POA: Diagnosis not present

## 2021-01-23 DIAGNOSIS — E559 Vitamin D deficiency, unspecified: Secondary | ICD-10-CM | POA: Diagnosis not present

## 2021-02-04 DIAGNOSIS — Z09 Encounter for follow-up examination after completed treatment for conditions other than malignant neoplasm: Secondary | ICD-10-CM | POA: Diagnosis not present

## 2021-02-04 DIAGNOSIS — E02 Subclinical iodine-deficiency hypothyroidism: Secondary | ICD-10-CM | POA: Diagnosis not present

## 2021-02-04 DIAGNOSIS — D8989 Other specified disorders involving the immune mechanism, not elsewhere classified: Secondary | ICD-10-CM | POA: Diagnosis not present

## 2021-02-04 DIAGNOSIS — K111 Hypertrophy of salivary gland: Secondary | ICD-10-CM | POA: Diagnosis not present

## 2021-02-04 DIAGNOSIS — E559 Vitamin D deficiency, unspecified: Secondary | ICD-10-CM | POA: Diagnosis not present

## 2021-02-06 DIAGNOSIS — D8989 Other specified disorders involving the immune mechanism, not elsewhere classified: Secondary | ICD-10-CM | POA: Diagnosis not present

## 2021-02-20 DIAGNOSIS — E119 Type 2 diabetes mellitus without complications: Secondary | ICD-10-CM | POA: Diagnosis not present

## 2021-02-20 DIAGNOSIS — Z794 Long term (current) use of insulin: Secondary | ICD-10-CM | POA: Diagnosis not present

## 2021-03-19 DIAGNOSIS — I10 Essential (primary) hypertension: Secondary | ICD-10-CM | POA: Diagnosis not present

## 2021-03-19 DIAGNOSIS — E7849 Other hyperlipidemia: Secondary | ICD-10-CM | POA: Diagnosis not present

## 2021-04-08 DIAGNOSIS — Z9889 Other specified postprocedural states: Secondary | ICD-10-CM | POA: Diagnosis not present

## 2021-04-08 DIAGNOSIS — E119 Type 2 diabetes mellitus without complications: Secondary | ICD-10-CM | POA: Diagnosis not present

## 2021-04-08 DIAGNOSIS — H04123 Dry eye syndrome of bilateral lacrimal glands: Secondary | ICD-10-CM | POA: Diagnosis not present

## 2021-04-08 DIAGNOSIS — H401131 Primary open-angle glaucoma, bilateral, mild stage: Secondary | ICD-10-CM | POA: Diagnosis not present

## 2021-04-08 DIAGNOSIS — H2513 Age-related nuclear cataract, bilateral: Secondary | ICD-10-CM | POA: Diagnosis not present

## 2021-05-05 DIAGNOSIS — N183 Chronic kidney disease, stage 3 unspecified: Secondary | ICD-10-CM | POA: Diagnosis not present

## 2021-05-05 DIAGNOSIS — Z299 Encounter for prophylactic measures, unspecified: Secondary | ICD-10-CM | POA: Diagnosis not present

## 2021-05-05 DIAGNOSIS — E1165 Type 2 diabetes mellitus with hyperglycemia: Secondary | ICD-10-CM | POA: Diagnosis not present

## 2021-05-05 DIAGNOSIS — E1122 Type 2 diabetes mellitus with diabetic chronic kidney disease: Secondary | ICD-10-CM | POA: Diagnosis not present

## 2021-05-05 DIAGNOSIS — I10 Essential (primary) hypertension: Secondary | ICD-10-CM | POA: Diagnosis not present

## 2021-05-12 DIAGNOSIS — M170 Bilateral primary osteoarthritis of knee: Secondary | ICD-10-CM | POA: Diagnosis not present

## 2021-05-19 DIAGNOSIS — G4700 Insomnia, unspecified: Secondary | ICD-10-CM | POA: Diagnosis not present

## 2021-05-19 DIAGNOSIS — J209 Acute bronchitis, unspecified: Secondary | ICD-10-CM | POA: Diagnosis not present

## 2021-05-29 DIAGNOSIS — H2511 Age-related nuclear cataract, right eye: Secondary | ICD-10-CM | POA: Diagnosis not present

## 2021-06-02 DIAGNOSIS — E119 Type 2 diabetes mellitus without complications: Secondary | ICD-10-CM | POA: Diagnosis not present

## 2021-06-02 DIAGNOSIS — Z794 Long term (current) use of insulin: Secondary | ICD-10-CM | POA: Diagnosis not present

## 2021-06-19 DIAGNOSIS — H2512 Age-related nuclear cataract, left eye: Secondary | ICD-10-CM | POA: Diagnosis not present

## 2021-07-03 DIAGNOSIS — G4733 Obstructive sleep apnea (adult) (pediatric): Secondary | ICD-10-CM | POA: Diagnosis not present

## 2021-07-22 DIAGNOSIS — E559 Vitamin D deficiency, unspecified: Secondary | ICD-10-CM | POA: Diagnosis not present

## 2021-07-22 DIAGNOSIS — E02 Subclinical iodine-deficiency hypothyroidism: Secondary | ICD-10-CM | POA: Diagnosis not present

## 2021-07-22 DIAGNOSIS — I10 Essential (primary) hypertension: Secondary | ICD-10-CM | POA: Diagnosis not present

## 2021-07-22 DIAGNOSIS — E7800 Pure hypercholesterolemia, unspecified: Secondary | ICD-10-CM | POA: Diagnosis not present

## 2021-07-22 DIAGNOSIS — E1165 Type 2 diabetes mellitus with hyperglycemia: Secondary | ICD-10-CM | POA: Diagnosis not present

## 2021-08-05 DIAGNOSIS — E559 Vitamin D deficiency, unspecified: Secondary | ICD-10-CM | POA: Diagnosis not present

## 2021-08-05 DIAGNOSIS — Z09 Encounter for follow-up examination after completed treatment for conditions other than malignant neoplasm: Secondary | ICD-10-CM | POA: Diagnosis not present

## 2021-08-05 DIAGNOSIS — E02 Subclinical iodine-deficiency hypothyroidism: Secondary | ICD-10-CM | POA: Diagnosis not present

## 2021-08-05 DIAGNOSIS — K111 Hypertrophy of salivary gland: Secondary | ICD-10-CM | POA: Diagnosis not present

## 2021-08-05 DIAGNOSIS — D8989 Other specified disorders involving the immune mechanism, not elsewhere classified: Secondary | ICD-10-CM | POA: Diagnosis not present

## 2021-08-05 DIAGNOSIS — I670 Dissection of cerebral arteries, nonruptured: Secondary | ICD-10-CM | POA: Diagnosis not present

## 2021-08-05 DIAGNOSIS — H0520 Unspecified exophthalmos: Secondary | ICD-10-CM | POA: Diagnosis not present

## 2021-08-12 DIAGNOSIS — N1832 Chronic kidney disease, stage 3b: Secondary | ICD-10-CM | POA: Diagnosis not present

## 2021-08-12 DIAGNOSIS — D849 Immunodeficiency, unspecified: Secondary | ICD-10-CM | POA: Diagnosis not present

## 2021-08-12 DIAGNOSIS — Z299 Encounter for prophylactic measures, unspecified: Secondary | ICD-10-CM | POA: Diagnosis not present

## 2021-08-12 DIAGNOSIS — H6122 Impacted cerumen, left ear: Secondary | ICD-10-CM | POA: Diagnosis not present

## 2021-08-12 DIAGNOSIS — I10 Essential (primary) hypertension: Secondary | ICD-10-CM | POA: Diagnosis not present

## 2021-08-12 DIAGNOSIS — Z789 Other specified health status: Secondary | ICD-10-CM | POA: Diagnosis not present

## 2021-08-19 DIAGNOSIS — G4700 Insomnia, unspecified: Secondary | ICD-10-CM | POA: Diagnosis not present

## 2021-08-19 DIAGNOSIS — J209 Acute bronchitis, unspecified: Secondary | ICD-10-CM | POA: Diagnosis not present

## 2021-08-21 DIAGNOSIS — Z794 Long term (current) use of insulin: Secondary | ICD-10-CM | POA: Diagnosis not present

## 2021-08-21 DIAGNOSIS — E119 Type 2 diabetes mellitus without complications: Secondary | ICD-10-CM | POA: Diagnosis not present

## 2021-08-26 DIAGNOSIS — Z79899 Other long term (current) drug therapy: Secondary | ICD-10-CM | POA: Diagnosis not present

## 2021-08-26 DIAGNOSIS — D582 Other hemoglobinopathies: Secondary | ICD-10-CM | POA: Diagnosis not present

## 2021-09-21 DIAGNOSIS — Z794 Long term (current) use of insulin: Secondary | ICD-10-CM | POA: Diagnosis not present

## 2021-09-21 DIAGNOSIS — E119 Type 2 diabetes mellitus without complications: Secondary | ICD-10-CM | POA: Diagnosis not present

## 2021-10-01 DIAGNOSIS — G4733 Obstructive sleep apnea (adult) (pediatric): Secondary | ICD-10-CM | POA: Diagnosis not present

## 2021-10-21 DIAGNOSIS — E119 Type 2 diabetes mellitus without complications: Secondary | ICD-10-CM | POA: Diagnosis not present

## 2021-10-21 DIAGNOSIS — Z794 Long term (current) use of insulin: Secondary | ICD-10-CM | POA: Diagnosis not present

## 2021-10-29 DIAGNOSIS — D8989 Other specified disorders involving the immune mechanism, not elsewhere classified: Secondary | ICD-10-CM | POA: Diagnosis not present

## 2021-11-03 DIAGNOSIS — M170 Bilateral primary osteoarthritis of knee: Secondary | ICD-10-CM | POA: Diagnosis not present

## 2021-11-10 DIAGNOSIS — E119 Type 2 diabetes mellitus without complications: Secondary | ICD-10-CM | POA: Diagnosis not present

## 2021-11-10 DIAGNOSIS — Z794 Long term (current) use of insulin: Secondary | ICD-10-CM | POA: Diagnosis not present

## 2021-11-11 DIAGNOSIS — D8989 Other specified disorders involving the immune mechanism, not elsewhere classified: Secondary | ICD-10-CM | POA: Diagnosis not present

## 2021-11-11 DIAGNOSIS — L409 Psoriasis, unspecified: Secondary | ICD-10-CM | POA: Diagnosis not present

## 2021-11-11 DIAGNOSIS — M1991 Primary osteoarthritis, unspecified site: Secondary | ICD-10-CM | POA: Diagnosis not present

## 2021-11-11 DIAGNOSIS — R600 Localized edema: Secondary | ICD-10-CM | POA: Diagnosis not present

## 2021-11-11 DIAGNOSIS — R5383 Other fatigue: Secondary | ICD-10-CM | POA: Diagnosis not present

## 2021-11-11 DIAGNOSIS — M1A9XX1 Chronic gout, unspecified, with tophus (tophi): Secondary | ICD-10-CM | POA: Diagnosis not present

## 2021-11-11 DIAGNOSIS — K111 Hypertrophy of salivary gland: Secondary | ICD-10-CM | POA: Diagnosis not present

## 2021-11-11 DIAGNOSIS — K760 Fatty (change of) liver, not elsewhere classified: Secondary | ICD-10-CM | POA: Diagnosis not present

## 2021-11-18 DIAGNOSIS — H04123 Dry eye syndrome of bilateral lacrimal glands: Secondary | ICD-10-CM | POA: Diagnosis not present

## 2021-11-18 DIAGNOSIS — H0102A Squamous blepharitis right eye, upper and lower eyelids: Secondary | ICD-10-CM | POA: Diagnosis not present

## 2021-11-18 DIAGNOSIS — Z961 Presence of intraocular lens: Secondary | ICD-10-CM | POA: Diagnosis not present

## 2021-11-18 DIAGNOSIS — H0102B Squamous blepharitis left eye, upper and lower eyelids: Secondary | ICD-10-CM | POA: Diagnosis not present

## 2021-11-18 DIAGNOSIS — E119 Type 2 diabetes mellitus without complications: Secondary | ICD-10-CM | POA: Diagnosis not present

## 2021-11-18 DIAGNOSIS — H401131 Primary open-angle glaucoma, bilateral, mild stage: Secondary | ICD-10-CM | POA: Diagnosis not present

## 2021-12-03 DIAGNOSIS — I10 Essential (primary) hypertension: Secondary | ICD-10-CM | POA: Diagnosis not present

## 2021-12-03 DIAGNOSIS — E1122 Type 2 diabetes mellitus with diabetic chronic kidney disease: Secondary | ICD-10-CM | POA: Diagnosis not present

## 2021-12-03 DIAGNOSIS — Z7189 Other specified counseling: Secondary | ICD-10-CM | POA: Diagnosis not present

## 2021-12-03 DIAGNOSIS — Z299 Encounter for prophylactic measures, unspecified: Secondary | ICD-10-CM | POA: Diagnosis not present

## 2021-12-03 DIAGNOSIS — Z0000 Encounter for general adult medical examination without abnormal findings: Secondary | ICD-10-CM | POA: Diagnosis not present

## 2021-12-03 DIAGNOSIS — N183 Chronic kidney disease, stage 3 unspecified: Secondary | ICD-10-CM | POA: Diagnosis not present

## 2021-12-11 DIAGNOSIS — E119 Type 2 diabetes mellitus without complications: Secondary | ICD-10-CM | POA: Diagnosis not present

## 2021-12-11 DIAGNOSIS — Z794 Long term (current) use of insulin: Secondary | ICD-10-CM | POA: Diagnosis not present

## 2022-01-06 DIAGNOSIS — G4733 Obstructive sleep apnea (adult) (pediatric): Secondary | ICD-10-CM | POA: Diagnosis not present

## 2022-01-11 DIAGNOSIS — Z794 Long term (current) use of insulin: Secondary | ICD-10-CM | POA: Diagnosis not present

## 2022-01-11 DIAGNOSIS — E119 Type 2 diabetes mellitus without complications: Secondary | ICD-10-CM | POA: Diagnosis not present

## 2022-01-13 DIAGNOSIS — L409 Psoriasis, unspecified: Secondary | ICD-10-CM | POA: Diagnosis not present

## 2022-01-13 DIAGNOSIS — K111 Hypertrophy of salivary gland: Secondary | ICD-10-CM | POA: Diagnosis not present

## 2022-01-13 DIAGNOSIS — D8989 Other specified disorders involving the immune mechanism, not elsewhere classified: Secondary | ICD-10-CM | POA: Diagnosis not present

## 2022-01-13 DIAGNOSIS — R600 Localized edema: Secondary | ICD-10-CM | POA: Diagnosis not present

## 2022-01-13 DIAGNOSIS — M1991 Primary osteoarthritis, unspecified site: Secondary | ICD-10-CM | POA: Diagnosis not present

## 2022-01-13 DIAGNOSIS — M1A9XX1 Chronic gout, unspecified, with tophus (tophi): Secondary | ICD-10-CM | POA: Diagnosis not present

## 2022-01-19 DIAGNOSIS — M170 Bilateral primary osteoarthritis of knee: Secondary | ICD-10-CM | POA: Diagnosis not present

## 2022-01-26 DIAGNOSIS — E7800 Pure hypercholesterolemia, unspecified: Secondary | ICD-10-CM | POA: Diagnosis not present

## 2022-01-26 DIAGNOSIS — H0589 Other disorders of orbit: Secondary | ICD-10-CM | POA: Diagnosis not present

## 2022-01-26 DIAGNOSIS — K111 Hypertrophy of salivary gland: Secondary | ICD-10-CM | POA: Diagnosis not present

## 2022-01-26 DIAGNOSIS — N189 Chronic kidney disease, unspecified: Secondary | ICD-10-CM | POA: Diagnosis not present

## 2022-01-26 DIAGNOSIS — R768 Other specified abnormal immunological findings in serum: Secondary | ICD-10-CM | POA: Diagnosis not present

## 2022-01-26 DIAGNOSIS — E559 Vitamin D deficiency, unspecified: Secondary | ICD-10-CM | POA: Diagnosis not present

## 2022-01-26 DIAGNOSIS — I10 Essential (primary) hypertension: Secondary | ICD-10-CM | POA: Diagnosis not present

## 2022-01-26 DIAGNOSIS — E02 Subclinical iodine-deficiency hypothyroidism: Secondary | ICD-10-CM | POA: Diagnosis not present

## 2022-01-26 DIAGNOSIS — E1165 Type 2 diabetes mellitus with hyperglycemia: Secondary | ICD-10-CM | POA: Diagnosis not present

## 2022-02-02 DIAGNOSIS — E119 Type 2 diabetes mellitus without complications: Secondary | ICD-10-CM | POA: Diagnosis not present

## 2022-02-02 DIAGNOSIS — Z794 Long term (current) use of insulin: Secondary | ICD-10-CM | POA: Diagnosis not present

## 2022-02-11 DIAGNOSIS — D8989 Other specified disorders involving the immune mechanism, not elsewhere classified: Secondary | ICD-10-CM | POA: Diagnosis not present

## 2022-03-16 DIAGNOSIS — L409 Psoriasis, unspecified: Secondary | ICD-10-CM | POA: Diagnosis not present

## 2022-03-16 DIAGNOSIS — D8989 Other specified disorders involving the immune mechanism, not elsewhere classified: Secondary | ICD-10-CM | POA: Diagnosis not present

## 2022-03-16 DIAGNOSIS — M1991 Primary osteoarthritis, unspecified site: Secondary | ICD-10-CM | POA: Diagnosis not present

## 2022-03-16 DIAGNOSIS — M1A9XX1 Chronic gout, unspecified, with tophus (tophi): Secondary | ICD-10-CM | POA: Diagnosis not present

## 2022-04-08 DIAGNOSIS — G4733 Obstructive sleep apnea (adult) (pediatric): Secondary | ICD-10-CM | POA: Diagnosis not present

## 2022-04-14 DIAGNOSIS — Z79899 Other long term (current) drug therapy: Secondary | ICD-10-CM | POA: Diagnosis not present

## 2022-04-14 DIAGNOSIS — M1A9XX1 Chronic gout, unspecified, with tophus (tophi): Secondary | ICD-10-CM | POA: Diagnosis not present

## 2022-04-20 DIAGNOSIS — M170 Bilateral primary osteoarthritis of knee: Secondary | ICD-10-CM | POA: Diagnosis not present

## 2022-04-23 DIAGNOSIS — E119 Type 2 diabetes mellitus without complications: Secondary | ICD-10-CM | POA: Diagnosis not present

## 2022-04-23 DIAGNOSIS — Z794 Long term (current) use of insulin: Secondary | ICD-10-CM | POA: Diagnosis not present

## 2022-06-16 DIAGNOSIS — M1A9XX1 Chronic gout, unspecified, with tophus (tophi): Secondary | ICD-10-CM | POA: Diagnosis not present

## 2022-06-16 DIAGNOSIS — L409 Psoriasis, unspecified: Secondary | ICD-10-CM | POA: Diagnosis not present

## 2022-06-16 DIAGNOSIS — K111 Hypertrophy of salivary gland: Secondary | ICD-10-CM | POA: Diagnosis not present

## 2022-06-16 DIAGNOSIS — D8989 Other specified disorders involving the immune mechanism, not elsewhere classified: Secondary | ICD-10-CM | POA: Diagnosis not present

## 2022-06-16 DIAGNOSIS — M1991 Primary osteoarthritis, unspecified site: Secondary | ICD-10-CM | POA: Diagnosis not present

## 2022-06-16 DIAGNOSIS — Z79899 Other long term (current) drug therapy: Secondary | ICD-10-CM | POA: Diagnosis not present

## 2022-06-16 DIAGNOSIS — R600 Localized edema: Secondary | ICD-10-CM | POA: Diagnosis not present

## 2022-07-08 DIAGNOSIS — G4733 Obstructive sleep apnea (adult) (pediatric): Secondary | ICD-10-CM | POA: Diagnosis not present

## 2022-07-14 DIAGNOSIS — Z794 Long term (current) use of insulin: Secondary | ICD-10-CM | POA: Diagnosis not present

## 2022-07-14 DIAGNOSIS — E119 Type 2 diabetes mellitus without complications: Secondary | ICD-10-CM | POA: Diagnosis not present

## 2022-07-22 ENCOUNTER — Other Ambulatory Visit (HOSPITAL_COMMUNITY): Payer: Self-pay | Admitting: Sports Medicine

## 2022-07-22 ENCOUNTER — Ambulatory Visit (HOSPITAL_COMMUNITY): Payer: Medicare Other

## 2022-07-22 DIAGNOSIS — M79604 Pain in right leg: Secondary | ICD-10-CM

## 2022-07-22 DIAGNOSIS — H401131 Primary open-angle glaucoma, bilateral, mild stage: Secondary | ICD-10-CM | POA: Diagnosis not present

## 2022-07-22 DIAGNOSIS — Z961 Presence of intraocular lens: Secondary | ICD-10-CM | POA: Diagnosis not present

## 2022-07-22 DIAGNOSIS — H43393 Other vitreous opacities, bilateral: Secondary | ICD-10-CM | POA: Diagnosis not present

## 2022-07-22 DIAGNOSIS — M170 Bilateral primary osteoarthritis of knee: Secondary | ICD-10-CM | POA: Diagnosis not present

## 2022-07-22 DIAGNOSIS — E119 Type 2 diabetes mellitus without complications: Secondary | ICD-10-CM | POA: Diagnosis not present

## 2022-07-22 DIAGNOSIS — H0102B Squamous blepharitis left eye, upper and lower eyelids: Secondary | ICD-10-CM | POA: Diagnosis not present

## 2022-07-22 DIAGNOSIS — H0102A Squamous blepharitis right eye, upper and lower eyelids: Secondary | ICD-10-CM | POA: Diagnosis not present

## 2022-07-22 DIAGNOSIS — H04123 Dry eye syndrome of bilateral lacrimal glands: Secondary | ICD-10-CM | POA: Diagnosis not present

## 2022-07-24 ENCOUNTER — Ambulatory Visit (HOSPITAL_COMMUNITY): Payer: Medicare Other

## 2022-07-24 ENCOUNTER — Encounter (HOSPITAL_COMMUNITY): Payer: Self-pay

## 2022-08-03 DIAGNOSIS — H0589 Other disorders of orbit: Secondary | ICD-10-CM | POA: Diagnosis not present

## 2022-08-03 DIAGNOSIS — E02 Subclinical iodine-deficiency hypothyroidism: Secondary | ICD-10-CM | POA: Diagnosis not present

## 2022-08-03 DIAGNOSIS — E7800 Pure hypercholesterolemia, unspecified: Secondary | ICD-10-CM | POA: Diagnosis not present

## 2022-08-03 DIAGNOSIS — N189 Chronic kidney disease, unspecified: Secondary | ICD-10-CM | POA: Diagnosis not present

## 2022-08-03 DIAGNOSIS — R768 Other specified abnormal immunological findings in serum: Secondary | ICD-10-CM | POA: Diagnosis not present

## 2022-08-03 DIAGNOSIS — E559 Vitamin D deficiency, unspecified: Secondary | ICD-10-CM | POA: Diagnosis not present

## 2022-08-03 DIAGNOSIS — E1165 Type 2 diabetes mellitus with hyperglycemia: Secondary | ICD-10-CM | POA: Diagnosis not present

## 2022-08-03 DIAGNOSIS — R600 Localized edema: Secondary | ICD-10-CM | POA: Diagnosis not present

## 2022-08-03 DIAGNOSIS — K111 Hypertrophy of salivary gland: Secondary | ICD-10-CM | POA: Diagnosis not present

## 2022-08-03 DIAGNOSIS — I10 Essential (primary) hypertension: Secondary | ICD-10-CM | POA: Diagnosis not present

## 2022-08-13 DIAGNOSIS — E1165 Type 2 diabetes mellitus with hyperglycemia: Secondary | ICD-10-CM | POA: Diagnosis not present

## 2022-08-13 DIAGNOSIS — I10 Essential (primary) hypertension: Secondary | ICD-10-CM | POA: Diagnosis not present

## 2022-08-13 DIAGNOSIS — Z299 Encounter for prophylactic measures, unspecified: Secondary | ICD-10-CM | POA: Diagnosis not present

## 2022-09-01 DIAGNOSIS — H0589 Other disorders of orbit: Secondary | ICD-10-CM | POA: Diagnosis not present

## 2022-09-01 DIAGNOSIS — I10 Essential (primary) hypertension: Secondary | ICD-10-CM | POA: Diagnosis not present

## 2022-09-01 DIAGNOSIS — R600 Localized edema: Secondary | ICD-10-CM | POA: Diagnosis not present

## 2022-09-01 DIAGNOSIS — E1165 Type 2 diabetes mellitus with hyperglycemia: Secondary | ICD-10-CM | POA: Diagnosis not present

## 2022-09-01 DIAGNOSIS — E7800 Pure hypercholesterolemia, unspecified: Secondary | ICD-10-CM | POA: Diagnosis not present

## 2022-09-01 DIAGNOSIS — R768 Other specified abnormal immunological findings in serum: Secondary | ICD-10-CM | POA: Diagnosis not present

## 2022-09-01 DIAGNOSIS — K111 Hypertrophy of salivary gland: Secondary | ICD-10-CM | POA: Diagnosis not present

## 2022-09-01 DIAGNOSIS — R9389 Abnormal findings on diagnostic imaging of other specified body structures: Secondary | ICD-10-CM | POA: Diagnosis not present

## 2022-09-01 DIAGNOSIS — E02 Subclinical iodine-deficiency hypothyroidism: Secondary | ICD-10-CM | POA: Diagnosis not present

## 2022-09-01 DIAGNOSIS — N189 Chronic kidney disease, unspecified: Secondary | ICD-10-CM | POA: Diagnosis not present

## 2022-09-01 DIAGNOSIS — E559 Vitamin D deficiency, unspecified: Secondary | ICD-10-CM | POA: Diagnosis not present

## 2022-10-07 DIAGNOSIS — G4733 Obstructive sleep apnea (adult) (pediatric): Secondary | ICD-10-CM | POA: Diagnosis not present

## 2022-10-10 DIAGNOSIS — E119 Type 2 diabetes mellitus without complications: Secondary | ICD-10-CM | POA: Diagnosis not present

## 2022-10-10 DIAGNOSIS — Z794 Long term (current) use of insulin: Secondary | ICD-10-CM | POA: Diagnosis not present

## 2022-10-22 DIAGNOSIS — Z299 Encounter for prophylactic measures, unspecified: Secondary | ICD-10-CM | POA: Diagnosis not present

## 2022-10-22 DIAGNOSIS — L03119 Cellulitis of unspecified part of limb: Secondary | ICD-10-CM | POA: Diagnosis not present

## 2022-10-22 DIAGNOSIS — I10 Essential (primary) hypertension: Secondary | ICD-10-CM | POA: Diagnosis not present

## 2022-10-22 DIAGNOSIS — S61459A Open bite of unspecified hand, initial encounter: Secondary | ICD-10-CM | POA: Diagnosis not present

## 2022-10-22 DIAGNOSIS — W5501XA Bitten by cat, initial encounter: Secondary | ICD-10-CM | POA: Diagnosis not present

## 2022-11-09 DIAGNOSIS — M170 Bilateral primary osteoarthritis of knee: Secondary | ICD-10-CM | POA: Diagnosis not present

## 2022-11-10 DIAGNOSIS — S00452A Superficial foreign body of left ear, initial encounter: Secondary | ICD-10-CM | POA: Diagnosis not present

## 2022-11-10 DIAGNOSIS — I10 Essential (primary) hypertension: Secondary | ICD-10-CM | POA: Diagnosis not present

## 2022-11-10 DIAGNOSIS — Z299 Encounter for prophylactic measures, unspecified: Secondary | ICD-10-CM | POA: Diagnosis not present

## 2022-11-13 DIAGNOSIS — Z0000 Encounter for general adult medical examination without abnormal findings: Secondary | ICD-10-CM | POA: Diagnosis not present

## 2022-11-13 DIAGNOSIS — Z299 Encounter for prophylactic measures, unspecified: Secondary | ICD-10-CM | POA: Diagnosis not present

## 2022-11-13 DIAGNOSIS — I10 Essential (primary) hypertension: Secondary | ICD-10-CM | POA: Diagnosis not present

## 2022-11-13 DIAGNOSIS — Z7189 Other specified counseling: Secondary | ICD-10-CM | POA: Diagnosis not present

## 2022-11-13 DIAGNOSIS — D692 Other nonthrombocytopenic purpura: Secondary | ICD-10-CM | POA: Diagnosis not present

## 2022-11-13 DIAGNOSIS — N1832 Chronic kidney disease, stage 3b: Secondary | ICD-10-CM | POA: Diagnosis not present

## 2022-12-16 DIAGNOSIS — L409 Psoriasis, unspecified: Secondary | ICD-10-CM | POA: Diagnosis not present

## 2022-12-16 DIAGNOSIS — D8989 Other specified disorders involving the immune mechanism, not elsewhere classified: Secondary | ICD-10-CM | POA: Diagnosis not present

## 2022-12-16 DIAGNOSIS — E119 Type 2 diabetes mellitus without complications: Secondary | ICD-10-CM | POA: Diagnosis not present

## 2022-12-16 DIAGNOSIS — M1A9XX1 Chronic gout, unspecified, with tophus (tophi): Secondary | ICD-10-CM | POA: Diagnosis not present

## 2022-12-16 DIAGNOSIS — E559 Vitamin D deficiency, unspecified: Secondary | ICD-10-CM | POA: Diagnosis not present

## 2022-12-16 DIAGNOSIS — M1991 Primary osteoarthritis, unspecified site: Secondary | ICD-10-CM | POA: Diagnosis not present

## 2022-12-16 DIAGNOSIS — Z79899 Other long term (current) drug therapy: Secondary | ICD-10-CM | POA: Diagnosis not present

## 2022-12-29 DIAGNOSIS — E119 Type 2 diabetes mellitus without complications: Secondary | ICD-10-CM | POA: Diagnosis not present

## 2022-12-31 DIAGNOSIS — I10 Essential (primary) hypertension: Secondary | ICD-10-CM | POA: Diagnosis not present

## 2022-12-31 DIAGNOSIS — E02 Subclinical iodine-deficiency hypothyroidism: Secondary | ICD-10-CM | POA: Diagnosis not present

## 2022-12-31 DIAGNOSIS — E1165 Type 2 diabetes mellitus with hyperglycemia: Secondary | ICD-10-CM | POA: Diagnosis not present

## 2022-12-31 DIAGNOSIS — E7800 Pure hypercholesterolemia, unspecified: Secondary | ICD-10-CM | POA: Diagnosis not present

## 2023-01-08 DIAGNOSIS — G4733 Obstructive sleep apnea (adult) (pediatric): Secondary | ICD-10-CM | POA: Diagnosis not present

## 2023-02-01 DIAGNOSIS — M170 Bilateral primary osteoarthritis of knee: Secondary | ICD-10-CM | POA: Diagnosis not present

## 2023-02-09 DIAGNOSIS — Z79899 Other long term (current) drug therapy: Secondary | ICD-10-CM | POA: Diagnosis not present

## 2023-02-09 DIAGNOSIS — E7800 Pure hypercholesterolemia, unspecified: Secondary | ICD-10-CM | POA: Diagnosis not present

## 2023-02-09 DIAGNOSIS — R5383 Other fatigue: Secondary | ICD-10-CM | POA: Diagnosis not present

## 2023-02-09 DIAGNOSIS — E039 Hypothyroidism, unspecified: Secondary | ICD-10-CM | POA: Diagnosis not present

## 2023-02-09 DIAGNOSIS — Z299 Encounter for prophylactic measures, unspecified: Secondary | ICD-10-CM | POA: Diagnosis not present

## 2023-02-09 DIAGNOSIS — Z0000 Encounter for general adult medical examination without abnormal findings: Secondary | ICD-10-CM | POA: Diagnosis not present

## 2023-02-09 DIAGNOSIS — I10 Essential (primary) hypertension: Secondary | ICD-10-CM | POA: Diagnosis not present

## 2023-02-22 ENCOUNTER — Other Ambulatory Visit (HOSPITAL_COMMUNITY): Payer: Self-pay | Admitting: Nephrology

## 2023-02-22 DIAGNOSIS — E1122 Type 2 diabetes mellitus with diabetic chronic kidney disease: Secondary | ICD-10-CM | POA: Diagnosis not present

## 2023-02-22 DIAGNOSIS — H0102A Squamous blepharitis right eye, upper and lower eyelids: Secondary | ICD-10-CM | POA: Diagnosis not present

## 2023-02-22 DIAGNOSIS — N189 Chronic kidney disease, unspecified: Secondary | ICD-10-CM

## 2023-02-22 DIAGNOSIS — E119 Type 2 diabetes mellitus without complications: Secondary | ICD-10-CM | POA: Diagnosis not present

## 2023-02-22 DIAGNOSIS — N1832 Chronic kidney disease, stage 3b: Secondary | ICD-10-CM

## 2023-02-22 DIAGNOSIS — H43393 Other vitreous opacities, bilateral: Secondary | ICD-10-CM | POA: Diagnosis not present

## 2023-02-22 DIAGNOSIS — G4733 Obstructive sleep apnea (adult) (pediatric): Secondary | ICD-10-CM | POA: Diagnosis not present

## 2023-02-22 DIAGNOSIS — Z961 Presence of intraocular lens: Secondary | ICD-10-CM | POA: Diagnosis not present

## 2023-02-22 DIAGNOSIS — I129 Hypertensive chronic kidney disease with stage 1 through stage 4 chronic kidney disease, or unspecified chronic kidney disease: Secondary | ICD-10-CM | POA: Diagnosis not present

## 2023-02-22 DIAGNOSIS — H0102B Squamous blepharitis left eye, upper and lower eyelids: Secondary | ICD-10-CM | POA: Diagnosis not present

## 2023-02-22 DIAGNOSIS — H04123 Dry eye syndrome of bilateral lacrimal glands: Secondary | ICD-10-CM | POA: Diagnosis not present

## 2023-02-22 DIAGNOSIS — H401131 Primary open-angle glaucoma, bilateral, mild stage: Secondary | ICD-10-CM | POA: Diagnosis not present

## 2023-03-01 DIAGNOSIS — M170 Bilateral primary osteoarthritis of knee: Secondary | ICD-10-CM | POA: Diagnosis not present

## 2023-03-03 ENCOUNTER — Ambulatory Visit (HOSPITAL_COMMUNITY)
Admission: RE | Admit: 2023-03-03 | Discharge: 2023-03-03 | Disposition: A | Discharge status: Home / Self Care | Payer: Medicare Other | Source: Ambulatory Visit | Attending: Nephrology | Admitting: Nephrology

## 2023-03-03 DIAGNOSIS — N1832 Chronic kidney disease, stage 3b: Secondary | ICD-10-CM | POA: Diagnosis not present

## 2023-03-03 DIAGNOSIS — N189 Chronic kidney disease, unspecified: Secondary | ICD-10-CM | POA: Insufficient documentation

## 2023-03-03 DIAGNOSIS — N2889 Other specified disorders of kidney and ureter: Secondary | ICD-10-CM | POA: Diagnosis not present

## 2023-03-03 DIAGNOSIS — D631 Anemia in chronic kidney disease: Secondary | ICD-10-CM | POA: Insufficient documentation

## 2023-03-08 DIAGNOSIS — M170 Bilateral primary osteoarthritis of knee: Secondary | ICD-10-CM | POA: Diagnosis not present

## 2023-03-09 DIAGNOSIS — N1832 Chronic kidney disease, stage 3b: Secondary | ICD-10-CM | POA: Diagnosis not present

## 2023-03-15 DIAGNOSIS — M170 Bilateral primary osteoarthritis of knee: Secondary | ICD-10-CM | POA: Diagnosis not present

## 2023-03-20 DIAGNOSIS — E119 Type 2 diabetes mellitus without complications: Secondary | ICD-10-CM | POA: Diagnosis not present

## 2023-03-29 DIAGNOSIS — I129 Hypertensive chronic kidney disease with stage 1 through stage 4 chronic kidney disease, or unspecified chronic kidney disease: Secondary | ICD-10-CM | POA: Diagnosis not present

## 2023-03-29 DIAGNOSIS — N1832 Chronic kidney disease, stage 3b: Secondary | ICD-10-CM | POA: Diagnosis not present

## 2023-03-29 DIAGNOSIS — E872 Acidosis, unspecified: Secondary | ICD-10-CM | POA: Diagnosis not present

## 2023-03-29 DIAGNOSIS — E1122 Type 2 diabetes mellitus with diabetic chronic kidney disease: Secondary | ICD-10-CM | POA: Diagnosis not present

## 2023-03-29 DIAGNOSIS — N189 Chronic kidney disease, unspecified: Secondary | ICD-10-CM | POA: Diagnosis not present

## 2023-04-05 DIAGNOSIS — N1832 Chronic kidney disease, stage 3b: Secondary | ICD-10-CM | POA: Diagnosis not present

## 2023-04-05 DIAGNOSIS — I129 Hypertensive chronic kidney disease with stage 1 through stage 4 chronic kidney disease, or unspecified chronic kidney disease: Secondary | ICD-10-CM | POA: Diagnosis not present

## 2023-04-05 DIAGNOSIS — E1122 Type 2 diabetes mellitus with diabetic chronic kidney disease: Secondary | ICD-10-CM | POA: Diagnosis not present

## 2023-04-05 DIAGNOSIS — G4733 Obstructive sleep apnea (adult) (pediatric): Secondary | ICD-10-CM | POA: Diagnosis not present

## 2023-04-19 DIAGNOSIS — Z299 Encounter for prophylactic measures, unspecified: Secondary | ICD-10-CM | POA: Diagnosis not present

## 2023-04-19 DIAGNOSIS — L02611 Cutaneous abscess of right foot: Secondary | ICD-10-CM | POA: Diagnosis not present

## 2023-04-19 DIAGNOSIS — N1832 Chronic kidney disease, stage 3b: Secondary | ICD-10-CM | POA: Diagnosis not present

## 2023-04-19 DIAGNOSIS — I10 Essential (primary) hypertension: Secondary | ICD-10-CM | POA: Diagnosis not present

## 2023-04-19 DIAGNOSIS — D692 Other nonthrombocytopenic purpura: Secondary | ICD-10-CM | POA: Diagnosis not present

## 2023-04-19 DIAGNOSIS — M109 Gout, unspecified: Secondary | ICD-10-CM | POA: Diagnosis not present

## 2023-04-20 ENCOUNTER — Other Ambulatory Visit (INDEPENDENT_AMBULATORY_CARE_PROVIDER_SITE_OTHER): Payer: Medicare Other

## 2023-04-20 ENCOUNTER — Ambulatory Visit: Payer: Medicare Other | Admitting: Orthopedic Surgery

## 2023-04-20 DIAGNOSIS — M79671 Pain in right foot: Secondary | ICD-10-CM

## 2023-04-21 ENCOUNTER — Encounter: Payer: Self-pay | Admitting: Orthopedic Surgery

## 2023-04-21 MED ORDER — COLCHICINE 0.6 MG PO CAPS: 0.6000 mg | ORAL_CAPSULE | Freq: Every day | ORAL | 3 refills | Status: AC | PRN

## 2023-04-21 NOTE — Addendum Note (Signed)
Addended by: Aldean Baker on: 04/21/2023 03:12 PM   Modules accepted: Orders

## 2023-04-21 NOTE — Progress Notes (Signed)
Office Visit Note   Patient: Patrick Kane           Date of Birth: [birth year]           MRN: [id number] Visit Date: 04/20/2023              Requested by: Ignatius Specking, MD [address] [place],  [place] [address] PCP: Ignatius Specking, MD  Chief Complaint  Patient presents with   Right Foot - Wound Check      HPI: Patient is a 67 year old gentleman who is seen for initial evaluation for draining tophaceous ulcer right great toe IP joint.  Patient is a type II diabetic he is on allopurinol 300 mg a day.  Patient is currently on Augmentin and Bactrim DS.  Assessment & Plan: Visit Diagnoses:  1. Pain in right foot     Plan: Patient will continue with Dial soap cleansing dry dressing changes.  Will start colchicine 0.6 mg daily and continue the allopurinol.  Follow-Up Instructions: Return in about 1 week (around 04/27/2023).   Ortho Exam  Patient is alert, oriented, no adenopathy, well-dressed, normal affect, normal respiratory effort. Examination patient has a palpable pulse.  Patient has a draining ulcer over the IP joint.  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  There is no abscess.   Imaging: XR Foot Complete Right  Result Date: 04/21/2023 Three-view radiographs of the right foot shows periarticular cystic changes IP joint of the right great toe as well as cystic changes at the navicular medial cuneiform.  No images are attached to the encounter.  Labs: Lab Results  Component Value Date   HGBA1C 7.1 (H) 12/19/2014   ESRSEDRATE 7 12/19/2014   ESRSEDRATE 13 11/29/2014   CRP 14.5 (H) 11/29/2014     Lab Results  Component Value Date   ALBUMIN 3.9 12/19/2014   ALBUMIN 4.1 11/29/2014    No results found for: "MG" No results found for: "VD25OH"  No results found for: "PREALBUMIN"    Latest Ref Rng & Units 01/31/2015    1:30 PM 12/20/2014    1:26 PM 11/29/2014    3:11 PM  CBC EXTENDED  WBC 4.0 - 10.5 K/uL 8.3  9.2   7.5   RBC 4.22 - 5.81 MIL/uL 5.02  5.10  5.17   Hemoglobin 13.0 - 17.0 g/dL 19.1  47.8  29.5   HCT 39.0 - 52.0 % 45.8  47.1  47.8   Platelets 150 - 400 K/uL 157  196  181   NEUT# 1.4 - 7.0 x10E3/uL  7.3    Lymph# 0.7 - 3.1 x10E3/uL  1.4       There is no height or weight on file to calculate BMI.  Orders:  Orders Placed This Encounter  Procedures   XR Foot Complete Right   No orders of the defined types were placed in this encounter.    Procedures: No procedures performed  Clinical Data: No additional findings.  ROS:  All other systems negative, except as noted in the HPI. Review of Systems  Objective: Vital Signs: There were no vitals taken for this visit.  Specialty Comments:  No specialty comments available.  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  Past Medical History:  Diagnosis Date   Arthritis    Diabetes (HCC)    Hypertension    Sleep apnea     Family History  Problem Relation Age of Onset   Colon cancer Father    Hypertension Father    Hypertension Mother    Heart attack Paternal Grandfather    Heart attack Maternal Grandfather     Past Surgical History:  Procedure Laterality Date   Biospy Right 2016   Parotid Biopsy    KNEE ARTHROSCOPY Bilateral    LASIK Bilateral 2008   Social History   Occupational History   Occupation: Retired   Tobacco Use   Smoking status: Never   Smokeless tobacco: Not on file  Substance and Sexual Activity   Alcohol use: No    Alcohol/week: 0.0 standard drinks of alcohol   Drug use: No   Sexual activity: Not on file

## 2023-04-25 ENCOUNTER — Encounter: Payer: Self-pay | Admitting: Orthopedic Surgery

## 2023-04-26 ENCOUNTER — Other Ambulatory Visit: Payer: Self-pay | Admitting: Orthopedic Surgery

## 2023-04-26 MED ORDER — DOXYCYCLINE HYCLATE 100 MG PO TABS: 100.0000 mg | ORAL_TABLET | Freq: Two times a day (BID) | ORAL | 0 refills | Status: DC

## 2023-04-27 ENCOUNTER — Telehealth: Payer: Self-pay | Admitting: Orthopedic Surgery

## 2023-04-27 NOTE — Telephone Encounter (Signed)
I called pt and spoke with Dr. Lajoyce Corners he advised the pt can stop the Augmentin and Bactrim DS that he was taking and take the doxycycline only. Take a probiotic as well. He will continue with his Allopurinol and colchicine and we will check a uric acid level when he comes for his appt on Thursday. Pt voiced understanding and will call with any other questions.

## 2023-04-27 NOTE — Telephone Encounter (Signed)
Patient called and wants to know is he supposed to take all three medications or just one? Also, other medication that was prescribed by the other doctor. [phone]

## 2023-04-29 ENCOUNTER — Ambulatory Visit: Payer: Medicare Other | Admitting: Orthopedic Surgery

## 2023-04-29 DIAGNOSIS — L97511 Non-pressure chronic ulcer of other part of right foot limited to breakdown of skin: Secondary | ICD-10-CM

## 2023-05-03 ENCOUNTER — Encounter: Payer: Self-pay | Admitting: Orthopedic Surgery

## 2023-05-03 DIAGNOSIS — M170 Bilateral primary osteoarthritis of knee: Secondary | ICD-10-CM | POA: Diagnosis not present

## 2023-05-03 NOTE — Progress Notes (Signed)
Office Visit Note   Patient: Patrick Kane           Date of Birth: 1956/05/12           MRN: [id number] Visit Date: 04/29/2023              Requested by: Ignatius Specking, MD [address] [place],  [place] [address] PCP: Ignatius Specking, MD  Chief Complaint  Patient presents with   Right Foot - Wound Check      HPI: Patient is a 67 year old gentleman who presents in follow-up for ulceration right foot IP joint right great toe.  Patient has been on doxycycline.  Patient is on allopurinol for gout.  Assessment & Plan: Visit Diagnoses:  1. Non-pressure chronic ulcer of other part of right foot limited to breakdown of skin (HCC)     Plan: Patient will wean off the colchicine continue allopurinol daily.  Uric acid on last examination was 5.3.  Follow-Up Instructions: No follow-ups on file.   Ortho Exam  Patient is alert, oriented, no adenopathy, well-dressed, normal affect, normal respiratory effort. Examination there is no tophaceous drainage.  There is no abscess the swelling has significantly decreased.  The previous draining tophaceous ulcer is now 1 mm in diameter with visible tophus but no drainage no cellulitis no abscess.  Imaging: No results found. No images are attached to the encounter.  Labs: Lab Results  Component Value Date   HGBA1C 7.1 (H) 12/19/2014   ESRSEDRATE 7 12/19/2014   ESRSEDRATE 13 11/29/2014   CRP 14.5 (H) 11/29/2014     Lab Results  Component Value Date   ALBUMIN 3.9 12/19/2014   ALBUMIN 4.1 11/29/2014    No results found for: "MG" No results found for: "VD25OH"  No results found for: "PREALBUMIN"    Latest Ref Rng & Units 01/31/2015    1:30 PM 12/20/2014    1:26 PM 11/29/2014    3:11 PM  CBC EXTENDED  WBC 4.0 - 10.5 K/uL 8.3  9.2  7.5   RBC 4.22 - 5.81 MIL/uL 5.02  5.10  5.17   Hemoglobin 13.0 - 17.0 g/dL 08.6  57.8  46.9   HCT 39.0 - 52.0 % 45.8  47.1  47.8   Platelets 150 - 400 K/uL 157  196  181   NEUT# 1.4 - 7.0 x10E3/uL  7.3     Lymph# 0.7 - 3.1 x10E3/uL  1.4       There is no height or weight on file to calculate BMI.  Orders:  No orders of the defined types were placed in this encounter.  No orders of the defined types were placed in this encounter.    Procedures: No procedures performed  Clinical Data: No additional findings.  ROS:  All other systems negative, except as noted in the HPI. Review of Systems  Objective: Vital Signs: There were no vitals taken for this visit.  Specialty Comments:  No specialty comments available.  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   Past Medical History:  Diagnosis Date   Arthritis    Diabetes (HCC)    Hypertension    Sleep apnea     Family History  Problem Relation Age of Onset   Colon cancer Father    Hypertension Father  Hypertension Mother    Heart attack Paternal Grandfather    Heart attack Maternal Grandfather     Past Surgical History:  Procedure Laterality Date   Biospy Right 2016   Parotid Biopsy    KNEE ARTHROSCOPY Bilateral    LASIK Bilateral 2008   Social History   Occupational History   Occupation: Retired   Tobacco Use   Smoking status: Never   Smokeless tobacco: Not on file  Substance and Sexual Activity   Alcohol use: No    Alcohol/week: 0.0 standard drinks of alcohol   Drug use: No   Sexual activity: Not on file

## 2023-06-10 DIAGNOSIS — Z79899 Other long term (current) drug therapy: Secondary | ICD-10-CM | POA: Diagnosis not present

## 2023-06-10 DIAGNOSIS — M1991 Primary osteoarthritis, unspecified site: Secondary | ICD-10-CM | POA: Diagnosis not present

## 2023-06-10 DIAGNOSIS — M1A9XX1 Chronic gout, unspecified, with tophus (tophi): Secondary | ICD-10-CM | POA: Diagnosis not present

## 2023-06-10 DIAGNOSIS — L409 Psoriasis, unspecified: Secondary | ICD-10-CM | POA: Diagnosis not present

## 2023-06-10 DIAGNOSIS — D8989 Other specified disorders involving the immune mechanism, not elsewhere classified: Secondary | ICD-10-CM | POA: Diagnosis not present

## 2023-06-23 DIAGNOSIS — N189 Chronic kidney disease, unspecified: Secondary | ICD-10-CM | POA: Diagnosis not present

## 2023-06-23 DIAGNOSIS — E872 Acidosis, unspecified: Secondary | ICD-10-CM | POA: Diagnosis not present

## 2023-06-23 DIAGNOSIS — I129 Hypertensive chronic kidney disease with stage 1 through stage 4 chronic kidney disease, or unspecified chronic kidney disease: Secondary | ICD-10-CM | POA: Diagnosis not present

## 2023-06-23 DIAGNOSIS — D7589 Other specified diseases of blood and blood-forming organs: Secondary | ICD-10-CM | POA: Diagnosis not present

## 2023-06-23 DIAGNOSIS — N1832 Chronic kidney disease, stage 3b: Secondary | ICD-10-CM | POA: Diagnosis not present

## 2023-06-23 DIAGNOSIS — D8984 IgG4-related disease: Secondary | ICD-10-CM | POA: Diagnosis not present

## 2023-06-24 DIAGNOSIS — N189 Chronic kidney disease, unspecified: Secondary | ICD-10-CM | POA: Diagnosis not present

## 2023-06-24 DIAGNOSIS — E559 Vitamin D deficiency, unspecified: Secondary | ICD-10-CM | POA: Diagnosis not present

## 2023-06-24 DIAGNOSIS — R768 Other specified abnormal immunological findings in serum: Secondary | ICD-10-CM | POA: Diagnosis not present

## 2023-06-24 DIAGNOSIS — E7800 Pure hypercholesterolemia, unspecified: Secondary | ICD-10-CM | POA: Diagnosis not present

## 2023-06-24 DIAGNOSIS — I10 Essential (primary) hypertension: Secondary | ICD-10-CM | POA: Diagnosis not present

## 2023-06-24 DIAGNOSIS — E1165 Type 2 diabetes mellitus with hyperglycemia: Secondary | ICD-10-CM | POA: Diagnosis not present

## 2023-06-24 DIAGNOSIS — H0589 Other disorders of orbit: Secondary | ICD-10-CM | POA: Diagnosis not present

## 2023-06-24 DIAGNOSIS — E02 Subclinical iodine-deficiency hypothyroidism: Secondary | ICD-10-CM | POA: Diagnosis not present

## 2023-06-24 DIAGNOSIS — K111 Hypertrophy of salivary gland: Secondary | ICD-10-CM | POA: Diagnosis not present

## 2023-07-02 DIAGNOSIS — I129 Hypertensive chronic kidney disease with stage 1 through stage 4 chronic kidney disease, or unspecified chronic kidney disease: Secondary | ICD-10-CM | POA: Diagnosis not present

## 2023-07-02 DIAGNOSIS — E1122 Type 2 diabetes mellitus with diabetic chronic kidney disease: Secondary | ICD-10-CM | POA: Diagnosis not present

## 2023-07-02 DIAGNOSIS — D8984 IgG4-related disease: Secondary | ICD-10-CM | POA: Diagnosis not present

## 2023-07-02 DIAGNOSIS — G4733 Obstructive sleep apnea (adult) (pediatric): Secondary | ICD-10-CM | POA: Diagnosis not present

## 2023-07-05 DIAGNOSIS — M170 Bilateral primary osteoarthritis of knee: Secondary | ICD-10-CM | POA: Diagnosis not present

## 2023-08-12 DIAGNOSIS — M25561 Pain in right knee: Secondary | ICD-10-CM | POA: Diagnosis not present

## 2023-08-16 DIAGNOSIS — D631 Anemia in chronic kidney disease: Secondary | ICD-10-CM | POA: Diagnosis not present

## 2023-08-16 DIAGNOSIS — E211 Secondary hyperparathyroidism, not elsewhere classified: Secondary | ICD-10-CM | POA: Diagnosis not present

## 2023-08-16 DIAGNOSIS — N189 Chronic kidney disease, unspecified: Secondary | ICD-10-CM | POA: Diagnosis not present

## 2023-08-16 DIAGNOSIS — R809 Proteinuria, unspecified: Secondary | ICD-10-CM | POA: Diagnosis not present

## 2023-08-25 DIAGNOSIS — N2581 Secondary hyperparathyroidism of renal origin: Secondary | ICD-10-CM | POA: Diagnosis not present

## 2023-08-25 DIAGNOSIS — H0102B Squamous blepharitis left eye, upper and lower eyelids: Secondary | ICD-10-CM | POA: Diagnosis not present

## 2023-08-25 DIAGNOSIS — H401131 Primary open-angle glaucoma, bilateral, mild stage: Secondary | ICD-10-CM | POA: Diagnosis not present

## 2023-08-25 DIAGNOSIS — N184 Chronic kidney disease, stage 4 (severe): Secondary | ICD-10-CM | POA: Diagnosis not present

## 2023-08-25 DIAGNOSIS — E875 Hyperkalemia: Secondary | ICD-10-CM | POA: Diagnosis not present

## 2023-08-25 DIAGNOSIS — Z961 Presence of intraocular lens: Secondary | ICD-10-CM | POA: Diagnosis not present

## 2023-08-25 DIAGNOSIS — H353131 Nonexudative age-related macular degeneration, bilateral, early dry stage: Secondary | ICD-10-CM | POA: Diagnosis not present

## 2023-08-25 DIAGNOSIS — E8722 Chronic metabolic acidosis: Secondary | ICD-10-CM | POA: Diagnosis not present

## 2023-08-25 DIAGNOSIS — H0102A Squamous blepharitis right eye, upper and lower eyelids: Secondary | ICD-10-CM | POA: Diagnosis not present

## 2023-08-25 DIAGNOSIS — H43393 Other vitreous opacities, bilateral: Secondary | ICD-10-CM | POA: Diagnosis not present

## 2023-08-25 DIAGNOSIS — E119 Type 2 diabetes mellitus without complications: Secondary | ICD-10-CM | POA: Diagnosis not present

## 2023-08-25 DIAGNOSIS — H04123 Dry eye syndrome of bilateral lacrimal glands: Secondary | ICD-10-CM | POA: Diagnosis not present

## 2023-08-27 DIAGNOSIS — I10 Essential (primary) hypertension: Secondary | ICD-10-CM | POA: Diagnosis not present

## 2023-08-27 DIAGNOSIS — R52 Pain, unspecified: Secondary | ICD-10-CM | POA: Diagnosis not present

## 2023-08-27 DIAGNOSIS — Z299 Encounter for prophylactic measures, unspecified: Secondary | ICD-10-CM | POA: Diagnosis not present

## 2023-08-27 DIAGNOSIS — M109 Gout, unspecified: Secondary | ICD-10-CM | POA: Diagnosis not present

## 2023-08-27 DIAGNOSIS — N1832 Chronic kidney disease, stage 3b: Secondary | ICD-10-CM | POA: Diagnosis not present

## 2023-08-27 DIAGNOSIS — M179 Osteoarthritis of knee, unspecified: Secondary | ICD-10-CM | POA: Diagnosis not present

## 2023-08-31 DIAGNOSIS — E1165 Type 2 diabetes mellitus with hyperglycemia: Secondary | ICD-10-CM | POA: Diagnosis not present

## 2023-09-02 ENCOUNTER — Encounter: Payer: Self-pay | Admitting: Orthopedic Surgery

## 2023-09-03 ENCOUNTER — Ambulatory Visit: Payer: Medicare Other | Admitting: Family

## 2023-09-03 DIAGNOSIS — L97511 Non-pressure chronic ulcer of other part of right foot limited to breakdown of skin: Secondary | ICD-10-CM

## 2023-09-03 DIAGNOSIS — M1A9XX1 Chronic gout, unspecified, with tophus (tophi): Secondary | ICD-10-CM | POA: Diagnosis not present

## 2023-09-03 MED ORDER — DOXYCYCLINE HYCLATE 100 MG PO TABS: 100.0000 mg | ORAL_TABLET | Freq: Two times a day (BID) | ORAL | 0 refills | Status: DC

## 2023-09-03 NOTE — Progress Notes (Signed)
Office Visit Note   Patient: Patrick Kane           Date of Birth: 25-Apr-1956           MRN: [id number] Visit Date: 09/03/2023              Requested by: Ignatius Specking, MD [address] [place],  [place] [address] PCP: Ignatius Specking, MD  Chief Complaint  Patient presents with   Right Foot - Pain      HPI: The patient is a 68 year old gentleman who presents today for ulceration and concern for infection right great toe.  Does have a history of gout, tophaceous gout with ulcerations in the past.  He has also had an episode of cellulitis associated with this ulcer.  Presents today for return of previous symptoms last episode October of last year.  Has been taking allopurinol.  Has not been using his colchicine.  Assessment & Plan: Visit Diagnoses:  1. Non-pressure chronic ulcer of other part of right foot limited to breakdown of skin (HCC)   2. Gout, tophaceous     Plan: Advise he continues to colchicine twice today and once daily for the next few days for pain and then to wean off.  Placed on course of doxycycline for cellulitis.  Will continue to keep the area clean and dry  Follow-Up Instructions: No follow-ups on file.   Ortho Exam  Patient is alert, oriented, no adenopathy, well-dressed, normal affect, normal respiratory effort. On examination right foot over the IP joint of the right great toe there there are 2 open areas these are 2 mm in diameter with tophaceous drainage there is no purulence there is surrounding erythema warmth and edema of the great toe no ascending cellulitis.  Imaging: No results found. No images are attached to the encounter.  Labs: Lab Results  Component Value Date   HGBA1C 7.1 (H) 12/19/2014   ESRSEDRATE 7 12/19/2014   ESRSEDRATE 13 11/29/2014   CRP 14.5 (H) 11/29/2014     Lab Results  Component Value Date   ALBUMIN 3.9 12/19/2014   ALBUMIN 4.1 11/29/2014    No results found for: "MG" No results found for: "VD25OH"  No results  found for: "PREALBUMIN"    Latest Ref Rng & Units 01/31/2015    1:30 PM 12/20/2014    1:26 PM 11/29/2014    3:11 PM  CBC EXTENDED  WBC 4.0 - 10.5 K/uL 8.3  9.2  7.5   RBC 4.22 - 5.81 MIL/uL 5.02  5.10  5.17   Hemoglobin 13.0 - 17.0 g/dL 24.4  01.0  27.2   HCT 39.0 - 52.0 % 45.8  47.1  47.8   Platelets 150 - 400 K/uL 157  196  181   NEUT# 1.4 - 7.0 x10E3/uL  7.3    Lymph# 0.7 - 3.1 x10E3/uL  1.4       There is no height or weight on file to calculate BMI.  Orders:  No orders of the defined types were placed in this encounter.  Meds ordered this encounter  Medications   doxycycline (VIBRA-TABS) 100 MG tablet    Sig: Take 1 tablet (100 mg total) by mouth 2 (two) times daily.    Dispense:  60 tablet    Refill:  0     Procedures: No procedures performed  Clinical Data: No additional findings.  ROS:  All other systems negative, except as noted in the HPI. Review of Systems  Objective: Vital Signs:  There were no vitals taken for this visit.  Specialty Comments:  No specialty comments available.  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   Past Medical History:  Diagnosis Date   Arthritis    Diabetes (HCC)    Hypertension    Sleep apnea     Family History  Problem Relation Age of Onset   Colon cancer Father    Hypertension Father    Hypertension Mother    Heart attack Paternal Grandfather    Heart attack Maternal Grandfather     Past Surgical History:  Procedure Laterality Date   Biospy Right 2016   Parotid Biopsy    KNEE ARTHROSCOPY Bilateral    LASIK Bilateral 2008   Social History   Occupational History   Occupation: Retired   Tobacco Use   Smoking status: Never   Smokeless tobacco: Not on file  Substance and Sexual Activity   Alcohol use:  No    Alcohol/week: 0.0 standard drinks of alcohol   Drug use: No   Sexual activity: Not on file

## 2023-09-08 ENCOUNTER — Encounter: Payer: Self-pay | Admitting: Family

## 2023-09-17 ENCOUNTER — Telehealth: Payer: Self-pay | Admitting: Family

## 2023-09-17 NOTE — Telephone Encounter (Signed)
 Called and SW pt's wife. She said that toe is not red, but looks "spongy". He couldn't finish the doxycycline due to severe stomach upset and diarrhea. His last dose was about 4-5 days ago. I offered an apt Monday, she said that he is still not feeling well and that won't work. I offered Tuesday at 3:15, she took that apt time. I said I could see about getting him another abx this weekend but she said well we've waited all this time without one (she said she kept forgetting to call us until it was after 5 pm). I stated that if anything changes to call the office and speak with on call MD.

## 2023-09-17 NOTE — Telephone Encounter (Signed)
 Patient's wife called. He can not take doxycycline. Says it still is red and spongy looking the right great toe. Her cb# is [phone] would like a call.

## 2023-09-21 ENCOUNTER — Ambulatory Visit: Payer: Medicare Other | Admitting: Orthopedic Surgery

## 2023-09-21 ENCOUNTER — Other Ambulatory Visit (INDEPENDENT_AMBULATORY_CARE_PROVIDER_SITE_OTHER)

## 2023-09-21 ENCOUNTER — Encounter: Payer: Self-pay | Admitting: Orthopedic Surgery

## 2023-09-21 DIAGNOSIS — M79671 Pain in right foot: Secondary | ICD-10-CM

## 2023-09-21 DIAGNOSIS — M869 Osteomyelitis, unspecified: Secondary | ICD-10-CM | POA: Diagnosis not present

## 2023-09-21 NOTE — Progress Notes (Signed)
 Office Visit Note   Patient: Patrick Kane           Date of Birth: July 02, 1956           MRN: [id number] Visit Date: 09/21/2023              Requested by: Ignatius Specking, MD [address] [place],  [place] [address] PCP: Ignatius Specking, MD  Chief Complaint  Patient presents with   Right Foot - Follow-up      HPI: Patient is a 68 year old gentleman who presents in follow-up for cellulitis right great toe.  Patient states that doxycycline caused nausea vomiting and diarrhea.  Patient states the toe is still red swollen and has had some drainage.  Patient is on allopurinol 100 mg 3 times a day.  Most recent uric acid was 3.4.  Assessment & Plan: Visit Diagnoses:  1. Pain in right foot   2. Osteomyelitis of great toe of right foot (HCC)     Plan: With the septic IP joint of the right great toe without resolution with antibiotics have recommended proceeding with amputation of the right great toe at the MTP joint.  Risk and benefits were discussed including risk of the wound not healing need for additional surgery.  Patient states he understands wished to proceed.  Follow-Up Instructions: Return in about 2 weeks (around 10/05/2023).   Ortho Exam  Patient is alert, oriented, no adenopathy, well-dressed, normal affect, normal respiratory effort. Examination patient has a palpable dorsalis pedis pulse.  Radiograph shows calcification of the arteries out to the metatarsal heads consistent with vascular disease with inline flow to the foot.  Patient has cellulitis and a small ulcer over the medial aspect of the great toe MTP joint.  Patient has no paronychial infection.  This ulcer was probed with a curette and extended down to the bone and there was a small amount of purulent drainage.  Imaging: XR Foot Complete Right Result Date: 09/21/2023 Three-view radiographs of the right foot shows destructive bony changes at the IP joint consistent with osteomyelitis.  No images are attached to the  encounter.  Labs: Lab Results  Component Value Date   HGBA1C 7.1 (H) 12/19/2014   ESRSEDRATE 7 12/19/2014   ESRSEDRATE 13 11/29/2014   CRP 14.5 (H) 11/29/2014     Lab Results  Component Value Date   ALBUMIN 3.9 12/19/2014   ALBUMIN 4.1 11/29/2014    No results found for: "MG" No results found for: "VD25OH"  No results found for: "PREALBUMIN"    Latest Ref Rng & Units 01/31/2015    1:30 PM 12/20/2014    1:26 PM 11/29/2014    3:11 PM  CBC EXTENDED  WBC 4.0 - 10.5 K/uL 8.3  9.2  7.5   RBC 4.22 - 5.81 MIL/uL 5.02  5.10  5.17   Hemoglobin 13.0 - 17.0 g/dL 19.1  47.8  29.5   HCT 39.0 - 52.0 % 45.8  47.1  47.8   Platelets 150 - 400 K/uL 157  196  181   NEUT# 1.4 - 7.0 x10E3/uL  7.3    Lymph# 0.7 - 3.1 x10E3/uL  1.4       There is no height or weight on file to calculate BMI.  Orders:  Orders Placed This Encounter  Procedures   XR Foot Complete Right   No orders of the defined types were placed in this encounter.    Procedures: No procedures performed  Clinical Data: No additional findings.  ROS:  All other systems negative, except as noted in the HPI. Review of Systems  Objective: Vital Signs: There were no vitals taken for this visit.  Specialty Comments:  No specialty comments available.  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   Past Medical History:  Diagnosis Date   Arthritis    Diabetes (HCC)    Hypertension    Sleep apnea     Family History  Problem Relation Age of Onset   Colon cancer Father    Hypertension Father    Hypertension Mother    Heart attack Paternal Grandfather    Heart attack Maternal Grandfather     Past Surgical History:  Procedure Laterality Date   Biospy Right 2016   Parotid Biopsy    KNEE ARTHROSCOPY  Bilateral    LASIK Bilateral 2008   Social History   Occupational History   Occupation: Retired   Tobacco Use   Smoking status: Never   Smokeless tobacco: Not on file  Substance and Sexual Activity   Alcohol use: No    Alcohol/week: 0.0 standard drinks of alcohol   Drug use: No   Sexual activity: Not on file

## 2023-09-23 ENCOUNTER — Other Ambulatory Visit: Payer: Self-pay

## 2023-09-23 ENCOUNTER — Encounter (HOSPITAL_COMMUNITY): Payer: Self-pay | Admitting: Orthopedic Surgery

## 2023-09-23 NOTE — Anesthesia Preprocedure Evaluation (Addendum)
 Anesthesia Evaluation  Patient identified by MRN, date of birth, ID band Patient awake    Reviewed: Allergy & Precautions, H&P , NPO status , Patient's Chart, lab work & pertinent test results  Airway Mallampati: III  TM Distance: >3 FB Neck ROM: Full    Dental no notable dental hx. (+) Teeth Intact, Dental Advisory Given   Pulmonary neg pulmonary ROS, sleep apnea    Pulmonary exam normal breath sounds clear to auscultation       Cardiovascular Exercise Tolerance: Good hypertension, Pt. on medications and Pt. on home beta blockers  Rhythm:Regular Rate:Normal     Neuro/Psych negative neurological ROS  negative psych ROS   GI/Hepatic negative GI ROS, Neg liver ROS,,,  Endo/Other  diabetes, Type 1, Insulin DependentHypothyroidism    Renal/GU negative Renal ROS  negative genitourinary   Musculoskeletal  (+) Arthritis , Osteoarthritis,    Abdominal   Peds  Hematology negative hematology ROS (+)   Anesthesia Other Findings   Reproductive/Obstetrics negative OB ROS                             Anesthesia Physical Anesthesia Plan  ASA: 3  Anesthesia Plan: MAC   Post-op Pain Management: Tylenol PO (pre-op)*   Induction: Intravenous  PONV Risk Score and Plan: 3 and Ondansetron, Dexamethasone and Propofol infusion  Airway Management Planned: Natural Airway and Simple Face Mask  Additional Equipment:   Intra-op Plan:   Post-operative Plan:   Informed Consent: I have reviewed the patients History and Physical, chart, labs and discussed the procedure including the risks, benefits and alternatives for the proposed anesthesia with the patient or authorized representative who has indicated his/her understanding and acceptance.     Dental advisory given  Plan Discussed with: CRNA  Anesthesia Plan Comments: (PAT note by Antionette Poles, PA-C: 68 year old male follows with rheumatology for  history of osteoarthritis, psoriasis, gout, IgG4 related disease.  Last seen 02/07/2023.  Per note, "Since his last visit he has been doing really well. He has not had any recurrence of the facial edema that he had in the past with his IgG4 related disease. He has not had any fevers or adenopathy, no abdominal pain. He notes that he thought he was having gout in his right great toe but it turned out to be an infection and since this was treated it is feeling and looking much better. He notes that he does not think he has had any gout flares and continues on allopurinol 600 mg daily and denies any side effects from this. He feels like his skin psoriasis which is present on his face is manageable and mild. At this time we will check medication and disease monitoring labs and if these are stable he will continue on his allopurinol. We will continue to monitor him with his IgG4 disease."  Other pertinent history includes IDDM 2, hypothyroid, OSA on CPAP, HTN, CKD.  Review of available labs in Care Everywhere shows baseline creatinine ~2-2.4.  Recent A1c was 5.5 on 01/01/2023.  Patient will need day of surgery labs and evaluation.   )        Anesthesia Quick Evaluation

## 2023-09-23 NOTE — Progress Notes (Signed)
 SDW CALL  Patient was given pre-op instructions over the phone. The opportunity was given for the patient to ask questions. No further questions asked. Patient verbalized understanding of instructions given.   PCP - Dr. Doreen Beam Cardiologist - denies Rheumatologist - Zenovia Jordan  PPM/ICD - denies Device Orders - n/a Rep Notified - n/a  Chest x-ray - denies EKG - DOS Stress Test - denies ECHO - denies Cardiac Cath - denies  Sleep Study - OSA+ CPAP - wears CPAP nightly  Fasting Blood Sugar - less than 120 - wears a continue glucose monitor   Last dose of GLP1 agonist-  n/a GLP1 instructions: n/a  Blood Thinner Instructions: n/a Aspirin Instructions: n/a  ERAS Protcol - clears until 0530 PRE-SURGERY Ensure or G2-  n/a  COVID TEST- n/a   Anesthesia review:  yes - IgG4 disease   Patient denies shortness of breath, fever, cough and chest pain over the phone call   All instructions explained to the patient, with a verbal understanding of the material. Patient agrees to go over the instructions while at home for a better understanding.

## 2023-09-23 NOTE — Progress Notes (Signed)
 Anesthesia Chart Review:  68 year old male follows with rheumatology for history of osteoarthritis, psoriasis, gout, IgG4 related disease.  Last seen 02/07/2023.  Per note, "Since his last visit he has been doing really well. He has not had any recurrence of the facial edema that he had in the past with his IgG4 related disease. He has not had any fevers or adenopathy, no abdominal pain. He notes that he thought he was having gout in his right great toe but it turned out to be an infection and since this was treated it is feeling and looking much better. He notes that he does not think he has had any gout flares and continues on allopurinol 600 mg daily and denies any side effects from this. He feels like his skin psoriasis which is present on his face is manageable and mild. At this time we will check medication and disease monitoring labs and if these are stable he will continue on his allopurinol. We will continue to monitor him with his IgG4 disease."  Other pertinent history includes IDDM 2, hypothyroid, OSA on CPAP, HTN, CKD.  Review of available labs in Care Everywhere shows baseline creatinine ~2-2.4.  Recent A1c was 5.5 on 01/01/2023.  Patient will need day of surgery labs and evaluation.     Zannie Cove [facility] Short Stay Center/Anesthesiology Phone [phone] 09/23/2023 3:19 PM

## 2023-09-24 ENCOUNTER — Ambulatory Visit (HOSPITAL_COMMUNITY): Payer: Self-pay | Admitting: Physician Assistant

## 2023-09-24 ENCOUNTER — Other Ambulatory Visit: Payer: Self-pay

## 2023-09-24 ENCOUNTER — Encounter (HOSPITAL_COMMUNITY)
Admission: RE | Disposition: A | Discharge status: Home / Self Care | Payer: Self-pay | Source: Home / Self Care | Attending: Orthopedic Surgery

## 2023-09-24 ENCOUNTER — Ambulatory Visit (HOSPITAL_COMMUNITY)
Admission: RE | Admit: 2023-09-24 | Discharge: 2023-09-24 | Disposition: A | Discharge status: Home / Self Care | Attending: Orthopedic Surgery | Admitting: Orthopedic Surgery

## 2023-09-24 ENCOUNTER — Encounter (HOSPITAL_COMMUNITY): Payer: Self-pay | Admitting: Orthopedic Surgery

## 2023-09-24 DIAGNOSIS — Z794 Long term (current) use of insulin: Secondary | ICD-10-CM | POA: Insufficient documentation

## 2023-09-24 DIAGNOSIS — I129 Hypertensive chronic kidney disease with stage 1 through stage 4 chronic kidney disease, or unspecified chronic kidney disease: Secondary | ICD-10-CM | POA: Insufficient documentation

## 2023-09-24 DIAGNOSIS — Z7952 Long term (current) use of systemic steroids: Secondary | ICD-10-CM | POA: Insufficient documentation

## 2023-09-24 DIAGNOSIS — M1990 Unspecified osteoarthritis, unspecified site: Secondary | ICD-10-CM | POA: Diagnosis not present

## 2023-09-24 DIAGNOSIS — G4733 Obstructive sleep apnea (adult) (pediatric): Secondary | ICD-10-CM | POA: Insufficient documentation

## 2023-09-24 DIAGNOSIS — N189 Chronic kidney disease, unspecified: Secondary | ICD-10-CM | POA: Insufficient documentation

## 2023-09-24 DIAGNOSIS — E1069 Type 1 diabetes mellitus with other specified complication: Secondary | ICD-10-CM | POA: Diagnosis not present

## 2023-09-24 DIAGNOSIS — M869 Osteomyelitis, unspecified: Secondary | ICD-10-CM

## 2023-09-24 DIAGNOSIS — E1022 Type 1 diabetes mellitus with diabetic chronic kidney disease: Secondary | ICD-10-CM | POA: Diagnosis not present

## 2023-09-24 DIAGNOSIS — M86171 Other acute osteomyelitis, right ankle and foot: Secondary | ICD-10-CM | POA: Diagnosis not present

## 2023-09-24 DIAGNOSIS — E039 Hypothyroidism, unspecified: Secondary | ICD-10-CM

## 2023-09-24 HISTORY — PX: AMPUTATION TOE: SHX6595

## 2023-09-24 HISTORY — DX: Hypothyroidism, unspecified: E03.9

## 2023-09-24 HISTORY — DX: Chronic kidney disease, unspecified: N18.9

## 2023-09-24 HISTORY — DX: Vitamin D deficiency, unspecified: E55.9

## 2023-09-24 HISTORY — DX: Selective deficiency of immunoglobulin g (igg) subclasses: D80.3

## 2023-09-24 LAB — POCT I-STAT, CHEM 8
BUN: 43 mg/dL — ABNORMAL HIGH (ref 8–23)
Calcium, Ion: 1.23 mmol/L (ref 1.15–1.40)
Chloride: 107 mmol/L (ref 98–111)
Creatinine, Ser: 2.6 mg/dL — ABNORMAL HIGH (ref 0.61–1.24)
Glucose, Bld: 102 mg/dL — ABNORMAL HIGH (ref 70–99)
HCT: 34 % — ABNORMAL LOW (ref 39.0–52.0)
Hemoglobin: 11.6 g/dL — ABNORMAL LOW (ref 13.0–17.0)
Potassium: 5.1 mmol/L (ref 3.5–5.1)
Sodium: 139 mmol/L (ref 135–145)
TCO2: 23 mmol/L (ref 22–32)

## 2023-09-24 LAB — GLUCOSE, CAPILLARY
Glucose-Capillary: 81 mg/dL (ref 70–99)
Glucose-Capillary: 111 mg/dL — ABNORMAL HIGH (ref 70–99)

## 2023-09-24 LAB — CBC
HCT: 36.4 % — ABNORMAL LOW (ref 39.0–52.0)
Hemoglobin: 12.1 g/dL — ABNORMAL LOW (ref 13.0–17.0)
MCH: 32.4 pg (ref 26.0–34.0)
MCHC: 33.2 g/dL (ref 30.0–36.0)
MCV: 97.3 fL (ref 80.0–100.0)
Platelets: 148 10*3/uL — ABNORMAL LOW (ref 150–400)
RBC: 3.74 MIL/uL — ABNORMAL LOW (ref 4.22–5.81)
RDW: 13 % (ref 11.5–15.5)
WBC: 6.4 10*3/uL (ref 4.0–10.5)
nRBC: 0 % (ref 0.0–0.2)

## 2023-09-24 SURGERY — AMPUTATION, TOE
Anesthesia: General | Site: Toe | Laterality: Right

## 2023-09-24 MED ORDER — LIDOCAINE HCL (PF) 1 % IJ SOLN
INTRAMUSCULAR | Status: DC | PRN
  Administered 2023-09-24: 24 mL

## 2023-09-24 MED ORDER — MIDAZOLAM HCL 2 MG/2ML IJ SOLN
INTRAMUSCULAR | Status: AC
  Filled 2023-09-24: qty 2

## 2023-09-24 MED ORDER — PROPOFOL 10 MG/ML IV BOLUS
INTRAVENOUS | Status: AC
  Filled 2023-09-24: qty 20

## 2023-09-24 MED ORDER — ACETAMINOPHEN 500 MG PO TABS
1000.0000 mg | ORAL_TABLET | Freq: Once | ORAL | Status: AC
  Administered 2023-09-24: 1000 mg via ORAL
  Filled 2023-09-24: qty 2

## 2023-09-24 MED ORDER — MIDAZOLAM HCL 2 MG/2ML IJ SOLN
INTRAMUSCULAR | Status: DC | PRN
  Administered 2023-09-24: 1 mg via INTRAVENOUS

## 2023-09-24 MED ORDER — SODIUM CHLORIDE 0.9 % IV SOLN: INTRAVENOUS | Status: DC

## 2023-09-24 MED ORDER — PROPOFOL 10 MG/ML IV BOLUS
INTRAVENOUS | Status: DC | PRN
  Administered 2023-09-24 (×4): 50 mg via INTRAVENOUS

## 2023-09-24 MED ORDER — VASHE WOUND IRRIGATION OPTIME
TOPICAL | Status: DC | PRN
  Administered 2023-09-24: 34 [oz_av]

## 2023-09-24 MED ORDER — FENTANYL CITRATE (PF) 100 MCG/2ML IJ SOLN: 25.0000 ug | INTRAMUSCULAR | Status: DC | PRN

## 2023-09-24 MED ORDER — CEFAZOLIN SODIUM-DEXTROSE 2-4 GM/100ML-% IV SOLN
2.0000 g | INTRAVENOUS | Status: AC
  Administered 2023-09-24: 2 g via INTRAVENOUS
  Filled 2023-09-24: qty 100

## 2023-09-24 MED ORDER — CHLORHEXIDINE GLUCONATE 0.12 % MT SOLN
15.0000 mL | Freq: Once | OROMUCOSAL | Status: AC
  Administered 2023-09-24: 15 mL via OROMUCOSAL
  Filled 2023-09-24: qty 15

## 2023-09-24 MED ORDER — 0.9 % SODIUM CHLORIDE (POUR BTL) OPTIME
TOPICAL | Status: DC | PRN
  Administered 2023-09-24: 1000 mL

## 2023-09-24 MED ORDER — INSULIN ASPART 100 UNIT/ML IJ SOLN
0.0000 [IU] | INTRAMUSCULAR | Status: DC | PRN
Start: 2023-09-24 — End: 2023-09-24

## 2023-09-24 MED ORDER — ORAL CARE MOUTH RINSE: 15.0000 mL | Freq: Once | OROMUCOSAL | Status: AC

## 2023-09-24 MED ORDER — OXYCODONE-ACETAMINOPHEN 5-325 MG PO TABS: 1.0000 | ORAL_TABLET | ORAL | 0 refills | Status: AC | PRN

## 2023-09-24 MED ORDER — LIDOCAINE HCL (PF) 1 % IJ SOLN
INTRAMUSCULAR | Status: AC
  Filled 2023-09-24: qty 30

## 2023-09-24 SURGICAL SUPPLY — 29 items
BAG COUNTER SPONGE SURGICOUNT (BAG) ×1 IMPLANT
BLADE SURG 21 STRL SS (BLADE) ×1 IMPLANT
BNDG COHESIVE 4X5 TAN STRL LF (GAUZE/BANDAGES/DRESSINGS) IMPLANT
BNDG COHESIVE 6X5 TAN NS LF (GAUZE/BANDAGES/DRESSINGS) ×1 IMPLANT
BNDG GAUZE DERMACEA FLUFF 4 (GAUZE/BANDAGES/DRESSINGS) ×1 IMPLANT
BNDG STRETCH GAUZE 3IN X12FT (GAUZE/BANDAGES/DRESSINGS) IMPLANT
COVER SURGICAL LIGHT HANDLE (MISCELLANEOUS) ×2 IMPLANT
DRAPE U-SHAPE 47X51 STRL (DRAPES) ×1 IMPLANT
DRSG ADAPTIC 3X8 NADH LF (GAUZE/BANDAGES/DRESSINGS) IMPLANT
DURAPREP 26ML APPLICATOR (WOUND CARE) ×1 IMPLANT
ELECT REM PT RETURN 9FT ADLT (ELECTROSURGICAL) ×1 IMPLANT
ELECTRODE REM PT RTRN 9FT ADLT (ELECTROSURGICAL) ×1 IMPLANT
GAUZE PAD ABD 8X10 STRL (GAUZE/BANDAGES/DRESSINGS) ×1 IMPLANT
GAUZE SPONGE 4X4 12PLY STRL (GAUZE/BANDAGES/DRESSINGS) IMPLANT
GLOVE BIOGEL PI IND STRL 9 (GLOVE) ×1 IMPLANT
GLOVE SURG ORTHO 9.0 STRL STRW (GLOVE) ×1 IMPLANT
GOWN STRL REUS W/ TWL XL LVL3 (GOWN DISPOSABLE) ×2 IMPLANT
KIT BASIN OR (CUSTOM PROCEDURE TRAY) ×1 IMPLANT
KIT TURNOVER KIT B (KITS) ×1 IMPLANT
MANIFOLD NEPTUNE II (INSTRUMENTS) ×1 IMPLANT
NDL 22X1.5 STRL (OR ONLY) (MISCELLANEOUS) IMPLANT
NEEDLE 22X1.5 STRL (OR ONLY) (MISCELLANEOUS) ×1 IMPLANT
NS IRRIG 1000ML POUR BTL (IV SOLUTION) ×1 IMPLANT
PACK ORTHO EXTREMITY (CUSTOM PROCEDURE TRAY) ×1 IMPLANT
PAD ABD 8X10 STRL (GAUZE/BANDAGES/DRESSINGS) IMPLANT
PAD ARMBOARD 7.5X6 YLW CONV (MISCELLANEOUS) ×2 IMPLANT
SUT ETHILON 2 0 PSLX (SUTURE) ×1 IMPLANT
SYR CONTROL 10ML LL (SYRINGE) IMPLANT
TOWEL GREEN STERILE (TOWEL DISPOSABLE) ×1 IMPLANT

## 2023-09-24 NOTE — Anesthesia Postprocedure Evaluation (Signed)
 Anesthesia Post Note  Patient: Patrick Kane  Procedure(s) Performed: AMPUTATION, TOE (Right: Toe)     Patient location during evaluation: PACU Anesthesia Type: General Level of consciousness: awake and alert Pain management: pain level controlled Vital Signs Assessment: post-procedure vital signs reviewed and stable Respiratory status: spontaneous breathing, nonlabored ventilation and respiratory function stable Cardiovascular status: stable and blood pressure returned to baseline Postop Assessment: no apparent nausea or vomiting Anesthetic complications: no  No notable events documented.  Last Vitals:  Vitals:   09/24/23 0900 09/24/23 0915  BP: 123/63   Pulse: 72   Resp: 15   Temp:  37 C  SpO2: 100%     Last Pain:  Vitals:   09/24/23 0915  TempSrc:   PainSc: 0-No pain                 Marili Vader,W. EDMOND

## 2023-09-24 NOTE — Op Note (Signed)
 09/24/2023  8:45 AM  PATIENT:  Patrick Kane    PRE-OPERATIVE DIAGNOSIS:  Osteomyelitis Right Great Toe  POST-OPERATIVE DIAGNOSIS:  Same  PROCEDURE:  AMPUTATION, TOE  SURGEON:  Nadara Mustard, MD  PHYSICIAN ASSISTANT:None ANESTHESIA:   General  PREOPERATIVE INDICATIONS:  [name] is a  68 y.o. male with a diagnosis of Osteomyelitis Right Great Toe who failed conservative measures and elected for surgical management.    The risks benefits and alternatives were discussed with the patient preoperatively including but not limited to the risks of infection, bleeding, nerve injury, cardiopulmonary complications, the need for revision surgery, among others, and the patient was willing to proceed.  OPERATIVE IMPLANTS:   * No implants in log *  @ENCIMAGES @  OPERATIVE FINDINGS: Patient had tophaceous gout deposits at the MTP joint.  Patient known history of gout.  OPERATIVE PROCEDURE: Patient brought the operating room and underwent a MAC anesthetic.  After adequate levels anesthesia obtained patient's right lower extremity was prepped using DuraPrep draped into a sterile field a timeout was called.  Patient underwent a regional block with 24 cc of 1% lidocaine plain.  After adequate levels anesthesia obtained a fishmouth incision was made just distal to the MTP joint of the great toe right foot was amputated through the MTP joint.  There were tophaceous deposits within the joint but no clinical signs of an abscess at the MTP joint.  The wound was irrigated with Vashe.  Electrocautery was used for hemostasis.  Incision closed using 2-0 nylon a sterile dressing was applied patient was taken the PACU in stable condition.   DISCHARGE PLANNING:  Antibiotic duration: Preoperative antibiotics  Weightbearing: Touchdown weightbearing on the right  Pain medication: Prescription for Percocet  Dressing care/ Wound VAC: Dry dressing  Ambulatory devices: Walker or crutches  Discharge to:  Home.  Follow-up: In the office 1 week post operative.

## 2023-09-24 NOTE — Transfer of Care (Signed)
 Immediate Anesthesia Transfer of Care Note  Patient: Patrick Kane  Procedure(s) Performed: AMPUTATION, TOE (Right: Toe)  Patient Location: PACU  Anesthesia Type:MAC  Level of Consciousness: drowsy and patient cooperative  Airway & Oxygen Therapy: Patient Spontanous Breathing  Post-op Assessment: Report given to RN, Post -op Vital signs reviewed and stable, and Patient moving all extremities X 4  Post vital signs: Reviewed and stable  Last Vitals:  Vitals Value Taken Time  BP 128/69 09/24/23 0852  Temp    Pulse 78 09/24/23 0853  Resp 16 09/24/23 0853  SpO2 100 % 09/24/23 0853  Vitals shown include unfiled device data.  Last Pain:  Vitals:   09/24/23 0706  TempSrc:   PainSc: 0-No pain         Complications: No notable events documented.

## 2023-09-24 NOTE — Progress Notes (Signed)
 Orthopedic Tech Progress Note Patient Details:  Patrick Kane 25-Jun-1956 [id number]  Ortho Devices Type of Ortho Device: Postop shoe/boot, Crutches Ortho Device/Splint Location: RLE Ortho Device/Splint Interventions: Ordered, Application, Adjustment   Post Interventions Patient Tolerated: Well Instructions Provided: Adjustment of device, Care of device  Tonye Pearson 09/24/2023, 9:51 AM

## 2023-09-24 NOTE — H&P (Signed)
 Patrick Kane is an 68 y.o. male.   Chief Complaint: Cellulitis and abscess right great toe HPI: Patient is a 69 year old gentleman who presents in follow-up for cellulitis right great toe. Patient states that doxycycline caused nausea vomiting and diarrhea. Patient states the toe is still red swollen and has had some drainage.   Past Medical History:  Diagnosis Date   Arthritis    Chronic kidney disease    Diabetes (HCC)    Hypertension    Hypothyroidism    IgG4 deficiency (HCC)    Sleep apnea    Vitamin D deficiency     Past Surgical History:  Procedure Laterality Date   Biospy Right 07/20/2014   Parotid Biopsy    CATARACT EXTRACTION     KNEE ARTHROSCOPY Bilateral    LASIK Bilateral 07/20/2006    Family History  Problem Relation Age of Onset   Colon cancer Father    Hypertension Father    Hypertension Mother    Heart attack Paternal Grandfather    Heart attack Maternal Grandfather    Social History:  reports that he has never smoked. He does not have any smokeless tobacco history on file. He reports that he does not drink alcohol and does not use drugs.  Allergies:  Allergies  Allergen Reactions   Codeine     Feels like "bugs are all over me" and "I cannot sleep"   Morphine Itching    Medications Prior to Admission  Medication Sig Dispense Refill   allopurinol (ZYLOPRIM) 300 MG tablet Take 300 mg by mouth daily.     amitriptyline (ELAVIL) 25 MG tablet Take 25 mg by mouth as needed for sleep.     atorvastatin (LIPITOR) 10 MG tablet Take 10 mg by mouth daily.     Coenzyme Q10 200 MG capsule Take 200 mg by mouth daily.     Colchicine 0.6 MG CAPS Take 1 capsule (0.6 mg total) by mouth daily as needed. 30 capsule 3   furosemide (LASIX) 20 MG tablet Take 20 mg by mouth.     HUMALOG KWIKPEN 100 UNIT/ML KwikPen Inject 15-20 Units into the skin 2 (two) times daily. Sliding Scale     latanoprost (XALATAN) 0.005 % ophthalmic solution Place 1 drop into both eyes at  bedtime.     levothyroxine (SYNTHROID) 75 MCG tablet Take 75 mcg by mouth daily.     metoprolol succinate (TOPROL-XL) 100 MG 24 hr tablet Take 100 mg by mouth daily.     multivitamin-lutein (OCUVITE-LUTEIN) CAPS capsule Take 1 capsule by mouth daily.     olmesartan (BENICAR) 20 MG tablet Take 20 mg by mouth daily.     TRESIBA FLEXTOUCH 100 UNIT/ML FlexTouch Pen Inject 12 Units into the skin in the morning.     triamterene-hydrochlorothiazide (MAXZIDE-25) 37.5-25 MG per tablet Take 0.5 tablets by mouth daily.     predniSONE (DELTASONE) 5 MG tablet Take 5 mg by mouth daily.      No results found for this or any previous visit (from the past 48 hours). No results found.  Review of Systems  All other systems reviewed and are negative.   Blood pressure (!) 171/58, pulse 80, temperature 98.5 F (36.9 C), temperature source Oral, resp. rate 20, height 5\' 9"  (1.753 m), weight (!) 170.1 kg, SpO2 99%. Physical Exam  Patient is alert, oriented, no adenopathy, well-dressed, normal affect, normal respiratory effort. Examination patient has a palpable dorsalis pedis pulse.  Radiograph shows calcification of the arteries out to the  metatarsal heads consistent with vascular disease with inline flow to the foot.  Patient has cellulitis and a small ulcer over the medial aspect of the great toe MTP joint.  Patient has no paronychial infection.  This ulcer was probed with a curette and extended down to the bone and there was a small amount of purulent drainage. Assessment/Plan 1. Pain in right foot   2. Osteomyelitis of great toe of right foot (HCC)       Plan: With the septic IP joint of the right great toe without resolution with antibiotics have recommended proceeding with amputation of the right great toe at the MTP joint.  Risk and benefits were discussed including risk of the wound not healing need for additional surgery.  Patient states he understands wished to proceed.  Nadara Mustard, MD 09/24/2023,  6:45 AM

## 2023-09-25 ENCOUNTER — Encounter (HOSPITAL_COMMUNITY): Payer: Self-pay | Admitting: Orthopedic Surgery

## 2023-10-05 ENCOUNTER — Ambulatory Visit (INDEPENDENT_AMBULATORY_CARE_PROVIDER_SITE_OTHER): Admitting: Orthopedic Surgery

## 2023-10-05 DIAGNOSIS — M869 Osteomyelitis, unspecified: Secondary | ICD-10-CM

## 2023-10-10 ENCOUNTER — Encounter: Payer: Self-pay | Admitting: Orthopedic Surgery

## 2023-10-10 NOTE — Progress Notes (Signed)
 Office Visit Note   Patient: Patrick Kane           Date of Birth: [birth year]           MRN: [id number] Visit Date: 10/05/2023              Requested by: Ignatius Specking, MD [address] [place],  [place] [address] PCP: Ignatius Specking, MD  Chief Complaint  Patient presents with   Right Foot - Routine Post Op    09/24/2023 right GT amputation       HPI: Patient is a 68 year old gentleman who is seen 11 days status post right great toe amputation he is weightbearing with crutches and a postoperative shoe.  Assessment & Plan: Visit Diagnoses: No diagnosis found.  Plan: Recommend he follow-up with Erin next week and follow-up with me in 2 weeks.  Follow-Up Instructions: Return in about 1 week (around 10/12/2023).   Ortho Exam  Patient is alert, oriented, no adenopathy, well-dressed, normal affect, normal respiratory effort. Examination the wound is well-approximated the incision is intact.  Will have him leave the sutures in for at least another week.  Imaging: No results found. No images are attached to the encounter.  Labs: Lab Results  Component Value Date   HGBA1C 7.1 (H) 12/19/2014   ESRSEDRATE 7 12/19/2014   ESRSEDRATE 13 11/29/2014   CRP 14.5 (H) 11/29/2014     Lab Results  Component Value Date   ALBUMIN 3.9 12/19/2014   ALBUMIN 4.1 11/29/2014    No results found for: "MG" No results found for: "VD25OH"  No results found for: "PREALBUMIN"    Latest Ref Rng & Units 09/24/2023    7:58 AM 09/24/2023    7:00 AM 01/31/2015    1:30 PM  CBC EXTENDED  WBC 4.0 - 10.5 K/uL  6.4  8.3   RBC 4.22 - 5.81 MIL/uL  3.74  5.02   Hemoglobin 13.0 - 17.0 g/dL 13.2  44.0  10.2   HCT 39.0 - 52.0 % 34.0  36.4  45.8   Platelets 150 - 400 K/uL  148  157      There is no height or weight on file to calculate BMI.  Orders:  No orders of the defined types were placed in this encounter.  No orders of the defined types were placed in this encounter.    Procedures: No procedures  performed  Clinical Data: No additional findings.  ROS:  All other systems negative, except as noted in the HPI. Review of Systems  Objective: Vital Signs: There were no vitals taken for this visit.  Specialty Comments:  No specialty comments available.  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   Past Medical History:  Diagnosis Date   Arthritis    Chronic kidney disease    Diabetes (HCC)    Hypertension    Hypothyroidism    IgG4 deficiency (HCC)    Sleep apnea    Vitamin D deficiency     Family History  Problem Relation Age of Onset   Colon cancer Father    Hypertension Father    Hypertension Mother    Heart attack Paternal Grandfather    Heart attack Maternal Grandfather  Past Surgical History:  Procedure Laterality Date   AMPUTATION TOE Right 09/24/2023   Procedure: AMPUTATION, TOE;  Surgeon: Nadara Mustard, MD;  Location: [facility] OR;  Service: Orthopedics;  Laterality: Right;  RIGHT GREAT TOE AMPUTATION   Biospy Right 07/20/2014   Parotid Biopsy    CATARACT EXTRACTION     KNEE ARTHROSCOPY Bilateral    LASIK Bilateral 07/20/2006   Social History   Occupational History   Occupation: Retired   Tobacco Use   Smoking status: Never   Smokeless tobacco: Not on file  Substance and Sexual Activity   Alcohol use: No    Alcohol/week: 0.0 standard drinks of alcohol   Drug use: No   Sexual activity: Not on file

## 2023-10-15 ENCOUNTER — Ambulatory Visit (INDEPENDENT_AMBULATORY_CARE_PROVIDER_SITE_OTHER): Admitting: Family

## 2023-10-15 DIAGNOSIS — D631 Anemia in chronic kidney disease: Secondary | ICD-10-CM | POA: Diagnosis not present

## 2023-10-15 DIAGNOSIS — N189 Chronic kidney disease, unspecified: Secondary | ICD-10-CM | POA: Diagnosis not present

## 2023-10-15 DIAGNOSIS — R809 Proteinuria, unspecified: Secondary | ICD-10-CM | POA: Diagnosis not present

## 2023-10-15 DIAGNOSIS — M869 Osteomyelitis, unspecified: Secondary | ICD-10-CM

## 2023-10-19 ENCOUNTER — Encounter: Payer: Self-pay | Admitting: Family

## 2023-10-19 NOTE — Progress Notes (Signed)
   Post-Op Visit Note   Patient: Patrick Kane           Date of Birth: Nov 09, 1955           MRN: [id number] Visit Date: 10/15/2023 PCP: Ignatius Specking, MD  Chief Complaint:  Chief Complaint  Patient presents with   Right Foot - Routine Post Op    HPI:  HPI The patient is a 68 year old gentleman seen status post right great toe amputation has been full weightbearing in a postop shoe Ortho Exam On examination right foot the incisions well-approximated sutures there is no gaping or drainage  Visit Diagnoses: No diagnosis found.  Plan: Sutures harvested today he will continue daily dose of cleansing.  Dry dressings.  May advance to regular shoewear.  Follow-Up Instructions: No follow-ups on file.   Imaging: No results found.  Orders:  No orders of the defined types were placed in this encounter.  No orders of the defined types were placed in this encounter.    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   Past Medical History:  Diagnosis Date   Arthritis    Chronic kidney disease    Diabetes (HCC)    Hypertension    Hypothyroidism    IgG4 deficiency (HCC)    Sleep apnea    Vitamin D deficiency     Family History  Problem Relation Age of Onset   Colon cancer Father    Hypertension Father    Hypertension Mother    Heart attack Paternal Grandfather    Heart attack Maternal Grandfather     Past Surgical History:  Procedure Laterality Date   AMPUTATION TOE Right 09/24/2023   Procedure: AMPUTATION, TOE;  Surgeon: Nadara Mustard, MD;  Location: [facility] OR;  Service: Orthopedics;  Laterality: Right;  RIGHT GREAT TOE AMPUTATION   Biospy Right 07/20/2014   Parotid Biopsy    CATARACT EXTRACTION     KNEE  ARTHROSCOPY Bilateral    LASIK Bilateral 07/20/2006   Social History   Occupational History   Occupation: Retired   Tobacco Use   Smoking status: Never   Smokeless tobacco: Not on file  Substance and Sexual Activity   Alcohol use: No    Alcohol/week: 0.0 standard drinks of alcohol   Drug use: No   Sexual activity: Not on file

## 2023-10-22 DIAGNOSIS — N2581 Secondary hyperparathyroidism of renal origin: Secondary | ICD-10-CM | POA: Diagnosis not present

## 2023-10-22 DIAGNOSIS — N1832 Chronic kidney disease, stage 3b: Secondary | ICD-10-CM | POA: Diagnosis not present

## 2023-10-22 DIAGNOSIS — E8722 Chronic metabolic acidosis: Secondary | ICD-10-CM | POA: Diagnosis not present

## 2023-10-22 DIAGNOSIS — N179 Acute kidney failure, unspecified: Secondary | ICD-10-CM | POA: Diagnosis not present

## 2023-10-25 DIAGNOSIS — M170 Bilateral primary osteoarthritis of knee: Secondary | ICD-10-CM | POA: Diagnosis not present

## 2023-10-26 ENCOUNTER — Encounter: Payer: Self-pay | Admitting: Family

## 2023-10-26 ENCOUNTER — Ambulatory Visit (INDEPENDENT_AMBULATORY_CARE_PROVIDER_SITE_OTHER): Admitting: Family

## 2023-10-26 DIAGNOSIS — M869 Osteomyelitis, unspecified: Secondary | ICD-10-CM

## 2023-10-26 NOTE — Progress Notes (Signed)
   Post-Op Visit Note   Patient: Patrick Kane           Date of Birth: 28-Jan-1956           MRN: [id number] Visit Date: 10/26/2023 PCP: Ignatius Specking, MD  Chief Complaint:  Chief Complaint  Patient presents with   Right Foot - Routine Post Op    09/24/23 Right GT amputation    HPI:  HPI The patient is a 68 year old gentleman seen status post right great toe amputation March 7 he today is full weightbearing in regular shoewear. no concerns Ortho Exam On examination right foot the great toe amputation site is well-healed there is no gaping no open area no erythema  Visit Diagnoses: No diagnosis found.  Plan: Advance activities as tolerated follow-up as needed Follow-Up Instructions: Return if symptoms worsen or fail to improve.   Imaging: No results found.  Orders:  No orders of the defined types were placed in this encounter.  No orders of the defined types were placed in this encounter.    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   Past Medical History:  Diagnosis Date   Arthritis    Chronic kidney disease    Diabetes (HCC)    Hypertension    Hypothyroidism    IgG4 deficiency (HCC)    Sleep apnea    Vitamin D deficiency     Family History  Problem Relation Age of Onset   Colon cancer Father    Hypertension Father    Hypertension Mother    Heart attack Paternal Grandfather    Heart attack Maternal Grandfather     Past Surgical History:  Procedure Laterality Date   AMPUTATION TOE Right 09/24/2023   Procedure: AMPUTATION, TOE;  Surgeon: Nadara Mustard, MD;  Location: [facility] OR;  Service: Orthopedics;  Laterality: Right;  RIGHT GREAT TOE AMPUTATION   Biospy Right 07/20/2014   Parotid Biopsy     CATARACT EXTRACTION     KNEE ARTHROSCOPY Bilateral    LASIK Bilateral 07/20/2006   Social History   Occupational History   Occupation: Retired   Tobacco Use   Smoking status: Never   Smokeless tobacco: Not on file  Substance and Sexual Activity   Alcohol use: No    Alcohol/week: 0.0 standard drinks of alcohol   Drug use: No   Sexual activity: Not on file

## 2023-10-27 ENCOUNTER — Encounter: Admitting: Family

## 2023-11-16 DIAGNOSIS — E7800 Pure hypercholesterolemia, unspecified: Secondary | ICD-10-CM | POA: Diagnosis not present

## 2023-11-16 DIAGNOSIS — Z713 Dietary counseling and surveillance: Secondary | ICD-10-CM | POA: Diagnosis not present

## 2023-11-16 DIAGNOSIS — R5383 Other fatigue: Secondary | ICD-10-CM | POA: Diagnosis not present

## 2023-11-16 DIAGNOSIS — Z1389 Encounter for screening for other disorder: Secondary | ICD-10-CM | POA: Diagnosis not present

## 2023-11-16 DIAGNOSIS — Z7189 Other specified counseling: Secondary | ICD-10-CM | POA: Diagnosis not present

## 2023-11-16 DIAGNOSIS — Z794 Long term (current) use of insulin: Secondary | ICD-10-CM | POA: Diagnosis not present

## 2023-11-16 DIAGNOSIS — Z299 Encounter for prophylactic measures, unspecified: Secondary | ICD-10-CM | POA: Diagnosis not present

## 2023-11-16 DIAGNOSIS — I10 Essential (primary) hypertension: Secondary | ICD-10-CM | POA: Diagnosis not present

## 2023-11-16 DIAGNOSIS — E039 Hypothyroidism, unspecified: Secondary | ICD-10-CM | POA: Diagnosis not present

## 2023-11-16 DIAGNOSIS — Z0000 Encounter for general adult medical examination without abnormal findings: Secondary | ICD-10-CM | POA: Diagnosis not present

## 2023-11-16 DIAGNOSIS — Z79899 Other long term (current) drug therapy: Secondary | ICD-10-CM | POA: Diagnosis not present

## 2023-11-18 DIAGNOSIS — R0602 Shortness of breath: Secondary | ICD-10-CM | POA: Diagnosis not present

## 2023-11-18 DIAGNOSIS — N184 Chronic kidney disease, stage 4 (severe): Secondary | ICD-10-CM | POA: Diagnosis not present

## 2023-11-18 DIAGNOSIS — E875 Hyperkalemia: Secondary | ICD-10-CM | POA: Diagnosis not present

## 2023-11-18 DIAGNOSIS — I10 Essential (primary) hypertension: Secondary | ICD-10-CM | POA: Diagnosis not present

## 2023-11-18 DIAGNOSIS — Z299 Encounter for prophylactic measures, unspecified: Secondary | ICD-10-CM | POA: Diagnosis not present

## 2023-11-25 DIAGNOSIS — N184 Chronic kidney disease, stage 4 (severe): Secondary | ICD-10-CM | POA: Diagnosis not present

## 2023-11-29 DIAGNOSIS — E1165 Type 2 diabetes mellitus with hyperglycemia: Secondary | ICD-10-CM | POA: Diagnosis not present

## 2023-12-06 DIAGNOSIS — M170 Bilateral primary osteoarthritis of knee: Secondary | ICD-10-CM | POA: Diagnosis not present

## 2024-01-03 DIAGNOSIS — M170 Bilateral primary osteoarthritis of knee: Secondary | ICD-10-CM | POA: Diagnosis not present

## 2024-01-17 DIAGNOSIS — E02 Subclinical iodine-deficiency hypothyroidism: Secondary | ICD-10-CM | POA: Diagnosis not present

## 2024-01-17 DIAGNOSIS — E7800 Pure hypercholesterolemia, unspecified: Secondary | ICD-10-CM | POA: Diagnosis not present

## 2024-01-17 DIAGNOSIS — E1165 Type 2 diabetes mellitus with hyperglycemia: Secondary | ICD-10-CM | POA: Diagnosis not present

## 2024-01-20 DIAGNOSIS — I10 Essential (primary) hypertension: Secondary | ICD-10-CM | POA: Diagnosis not present

## 2024-01-20 DIAGNOSIS — E559 Vitamin D deficiency, unspecified: Secondary | ICD-10-CM | POA: Diagnosis not present

## 2024-01-20 DIAGNOSIS — E02 Subclinical iodine-deficiency hypothyroidism: Secondary | ICD-10-CM | POA: Diagnosis not present

## 2024-01-20 DIAGNOSIS — E7800 Pure hypercholesterolemia, unspecified: Secondary | ICD-10-CM | POA: Diagnosis not present

## 2024-01-20 DIAGNOSIS — E1165 Type 2 diabetes mellitus with hyperglycemia: Secondary | ICD-10-CM | POA: Diagnosis not present

## 2024-01-31 DIAGNOSIS — E119 Type 2 diabetes mellitus without complications: Secondary | ICD-10-CM | POA: Diagnosis not present

## 2024-01-31 DIAGNOSIS — D631 Anemia in chronic kidney disease: Secondary | ICD-10-CM | POA: Diagnosis not present

## 2024-01-31 DIAGNOSIS — I10 Essential (primary) hypertension: Secondary | ICD-10-CM | POA: Diagnosis not present

## 2024-01-31 DIAGNOSIS — R809 Proteinuria, unspecified: Secondary | ICD-10-CM | POA: Diagnosis not present

## 2024-01-31 DIAGNOSIS — N189 Chronic kidney disease, unspecified: Secondary | ICD-10-CM | POA: Diagnosis not present

## 2024-02-08 DIAGNOSIS — G4733 Obstructive sleep apnea (adult) (pediatric): Secondary | ICD-10-CM | POA: Diagnosis not present

## 2024-02-08 DIAGNOSIS — E1122 Type 2 diabetes mellitus with diabetic chronic kidney disease: Secondary | ICD-10-CM | POA: Diagnosis not present

## 2024-02-08 DIAGNOSIS — I129 Hypertensive chronic kidney disease with stage 1 through stage 4 chronic kidney disease, or unspecified chronic kidney disease: Secondary | ICD-10-CM | POA: Diagnosis not present

## 2024-02-08 DIAGNOSIS — N1832 Chronic kidney disease, stage 3b: Secondary | ICD-10-CM | POA: Diagnosis not present

## 2024-02-09 DIAGNOSIS — M1A9XX1 Chronic gout, unspecified, with tophus (tophi): Secondary | ICD-10-CM | POA: Diagnosis not present

## 2024-02-09 DIAGNOSIS — L409 Psoriasis, unspecified: Secondary | ICD-10-CM | POA: Diagnosis not present

## 2024-02-09 DIAGNOSIS — Z79899 Other long term (current) drug therapy: Secondary | ICD-10-CM | POA: Diagnosis not present

## 2024-02-09 DIAGNOSIS — D8989 Other specified disorders involving the immune mechanism, not elsewhere classified: Secondary | ICD-10-CM | POA: Diagnosis not present

## 2024-02-09 DIAGNOSIS — M1991 Primary osteoarthritis, unspecified site: Secondary | ICD-10-CM | POA: Diagnosis not present

## 2024-02-16 DIAGNOSIS — I10 Essential (primary) hypertension: Secondary | ICD-10-CM | POA: Diagnosis not present

## 2024-02-16 DIAGNOSIS — Z79899 Other long term (current) drug therapy: Secondary | ICD-10-CM | POA: Diagnosis not present

## 2024-02-16 DIAGNOSIS — E039 Hypothyroidism, unspecified: Secondary | ICD-10-CM | POA: Diagnosis not present

## 2024-02-16 DIAGNOSIS — Z299 Encounter for prophylactic measures, unspecified: Secondary | ICD-10-CM | POA: Diagnosis not present

## 2024-02-16 DIAGNOSIS — R5383 Other fatigue: Secondary | ICD-10-CM | POA: Diagnosis not present

## 2024-02-16 DIAGNOSIS — E7800 Pure hypercholesterolemia, unspecified: Secondary | ICD-10-CM | POA: Diagnosis not present

## 2024-02-16 DIAGNOSIS — Z0000 Encounter for general adult medical examination without abnormal findings: Secondary | ICD-10-CM | POA: Diagnosis not present

## 2024-02-23 DIAGNOSIS — D8984 IgG4-related disease: Secondary | ICD-10-CM | POA: Diagnosis not present

## 2024-02-23 DIAGNOSIS — E1122 Type 2 diabetes mellitus with diabetic chronic kidney disease: Secondary | ICD-10-CM | POA: Diagnosis not present

## 2024-02-23 DIAGNOSIS — I129 Hypertensive chronic kidney disease with stage 1 through stage 4 chronic kidney disease, or unspecified chronic kidney disease: Secondary | ICD-10-CM | POA: Diagnosis not present

## 2024-02-23 DIAGNOSIS — N1832 Chronic kidney disease, stage 3b: Secondary | ICD-10-CM | POA: Diagnosis not present

## 2024-03-01 DIAGNOSIS — E1165 Type 2 diabetes mellitus with hyperglycemia: Secondary | ICD-10-CM | POA: Diagnosis not present

## 2024-03-02 DIAGNOSIS — Z1211 Encounter for screening for malignant neoplasm of colon: Secondary | ICD-10-CM | POA: Diagnosis not present

## 2024-03-02 DIAGNOSIS — Z1212 Encounter for screening for malignant neoplasm of rectum: Secondary | ICD-10-CM | POA: Diagnosis not present

## 2024-03-08 DIAGNOSIS — E1165 Type 2 diabetes mellitus with hyperglycemia: Secondary | ICD-10-CM | POA: Diagnosis not present

## 2024-03-13 DIAGNOSIS — M170 Bilateral primary osteoarthritis of knee: Secondary | ICD-10-CM | POA: Diagnosis not present

## 2024-03-17 DIAGNOSIS — R262 Difficulty in walking, not elsewhere classified: Secondary | ICD-10-CM | POA: Diagnosis not present

## 2024-03-17 DIAGNOSIS — M25562 Pain in left knee: Secondary | ICD-10-CM | POA: Diagnosis not present

## 2024-03-17 DIAGNOSIS — M25561 Pain in right knee: Secondary | ICD-10-CM | POA: Diagnosis not present

## 2024-03-17 DIAGNOSIS — M170 Bilateral primary osteoarthritis of knee: Secondary | ICD-10-CM | POA: Diagnosis not present

## 2024-03-24 DIAGNOSIS — M25562 Pain in left knee: Secondary | ICD-10-CM | POA: Diagnosis not present

## 2024-03-24 DIAGNOSIS — R262 Difficulty in walking, not elsewhere classified: Secondary | ICD-10-CM | POA: Diagnosis not present

## 2024-03-24 DIAGNOSIS — M25561 Pain in right knee: Secondary | ICD-10-CM | POA: Diagnosis not present

## 2024-03-24 DIAGNOSIS — M170 Bilateral primary osteoarthritis of knee: Secondary | ICD-10-CM | POA: Diagnosis not present

## 2024-03-24 DIAGNOSIS — N184 Chronic kidney disease, stage 4 (severe): Secondary | ICD-10-CM | POA: Diagnosis not present

## 2024-03-31 DIAGNOSIS — M1712 Unilateral primary osteoarthritis, left knee: Secondary | ICD-10-CM | POA: Diagnosis not present

## 2024-03-31 DIAGNOSIS — M25661 Stiffness of right knee, not elsewhere classified: Secondary | ICD-10-CM | POA: Diagnosis not present

## 2024-03-31 DIAGNOSIS — M25662 Stiffness of left knee, not elsewhere classified: Secondary | ICD-10-CM | POA: Diagnosis not present

## 2024-03-31 DIAGNOSIS — M25562 Pain in left knee: Secondary | ICD-10-CM | POA: Diagnosis not present

## 2024-03-31 DIAGNOSIS — R262 Difficulty in walking, not elsewhere classified: Secondary | ICD-10-CM | POA: Diagnosis not present

## 2024-03-31 DIAGNOSIS — M1711 Unilateral primary osteoarthritis, right knee: Secondary | ICD-10-CM | POA: Diagnosis not present

## 2024-03-31 DIAGNOSIS — M170 Bilateral primary osteoarthritis of knee: Secondary | ICD-10-CM | POA: Diagnosis not present

## 2024-03-31 DIAGNOSIS — M25561 Pain in right knee: Secondary | ICD-10-CM | POA: Diagnosis not present

## 2024-04-07 DIAGNOSIS — G4730 Sleep apnea, unspecified: Secondary | ICD-10-CM | POA: Diagnosis not present

## 2024-04-07 DIAGNOSIS — N184 Chronic kidney disease, stage 4 (severe): Secondary | ICD-10-CM | POA: Diagnosis not present

## 2024-04-07 DIAGNOSIS — Z794 Long term (current) use of insulin: Secondary | ICD-10-CM | POA: Diagnosis not present

## 2024-04-07 DIAGNOSIS — Z299 Encounter for prophylactic measures, unspecified: Secondary | ICD-10-CM | POA: Diagnosis not present

## 2024-04-07 DIAGNOSIS — I10 Essential (primary) hypertension: Secondary | ICD-10-CM | POA: Diagnosis not present

## 2024-04-07 DIAGNOSIS — Z23 Encounter for immunization: Secondary | ICD-10-CM | POA: Diagnosis not present

## 2024-05-01 DIAGNOSIS — Z961 Presence of intraocular lens: Secondary | ICD-10-CM | POA: Diagnosis not present

## 2024-05-01 DIAGNOSIS — H0102B Squamous blepharitis left eye, upper and lower eyelids: Secondary | ICD-10-CM | POA: Diagnosis not present

## 2024-05-01 DIAGNOSIS — H401131 Primary open-angle glaucoma, bilateral, mild stage: Secondary | ICD-10-CM | POA: Diagnosis not present

## 2024-05-01 DIAGNOSIS — H0102A Squamous blepharitis right eye, upper and lower eyelids: Secondary | ICD-10-CM | POA: Diagnosis not present

## 2024-05-01 DIAGNOSIS — H04123 Dry eye syndrome of bilateral lacrimal glands: Secondary | ICD-10-CM | POA: Diagnosis not present

## 2024-05-10 DIAGNOSIS — R809 Proteinuria, unspecified: Secondary | ICD-10-CM | POA: Diagnosis not present

## 2024-05-10 DIAGNOSIS — N189 Chronic kidney disease, unspecified: Secondary | ICD-10-CM | POA: Diagnosis not present

## 2024-05-10 DIAGNOSIS — D631 Anemia in chronic kidney disease: Secondary | ICD-10-CM | POA: Diagnosis not present

## 2024-05-15 DIAGNOSIS — E875 Hyperkalemia: Secondary | ICD-10-CM | POA: Diagnosis not present

## 2024-05-15 DIAGNOSIS — N184 Chronic kidney disease, stage 4 (severe): Secondary | ICD-10-CM | POA: Diagnosis not present

## 2024-05-15 DIAGNOSIS — E8722 Chronic metabolic acidosis: Secondary | ICD-10-CM | POA: Diagnosis not present

## 2024-05-15 DIAGNOSIS — N2581 Secondary hyperparathyroidism of renal origin: Secondary | ICD-10-CM | POA: Diagnosis not present

## 2024-05-22 ENCOUNTER — Encounter: Payer: Self-pay | Admitting: Radiology
